# Patient Record
Sex: Male | Born: 1970 | Race: White | Hispanic: No | Marital: Married | Smoking: Current every day smoker
Health system: Southern US, Community
[De-identification: ages and names within clinical notes are randomized; demographics above are authoritative.]

## PROBLEM LIST (undated history)

## (undated) DIAGNOSIS — I10 Essential (primary) hypertension: Secondary | ICD-10-CM

## (undated) DIAGNOSIS — F411 Generalized anxiety disorder: Secondary | ICD-10-CM

## (undated) HISTORY — DX: Generalized anxiety disorder: F41.1

## (undated) HISTORY — DX: Essential (primary) hypertension: I10

## (undated) HISTORY — PX: NO PAST SURGERIES: SHX2092

---

## 2013-11-25 ENCOUNTER — Encounter: Payer: Self-pay | Admitting: Family Medicine

## 2013-11-25 ENCOUNTER — Ambulatory Visit (INDEPENDENT_AMBULATORY_CARE_PROVIDER_SITE_OTHER): Payer: Managed Care, Other (non HMO) | Admitting: Family Medicine

## 2013-11-25 VITALS — BP 135/85 | HR 91 | Ht 71.0 in | Wt 323.0 lb

## 2013-11-25 DIAGNOSIS — F411 Generalized anxiety disorder: Secondary | ICD-10-CM

## 2013-11-25 MED ORDER — ESCITALOPRAM OXALATE 10 MG PO TABS: 10.0000 mg | ORAL_TABLET | Freq: Every day | ORAL | Status: DC
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
## 2013-12-23 ENCOUNTER — Ambulatory Visit (INDEPENDENT_AMBULATORY_CARE_PROVIDER_SITE_OTHER): Payer: Managed Care, Other (non HMO) | Admitting: Family Medicine

## 2013-12-23 ENCOUNTER — Encounter: Payer: Self-pay | Admitting: Family Medicine

## 2013-12-23 VITALS — BP 133/78 | HR 92 | Resp 18 | Wt 325.0 lb

## 2013-12-23 DIAGNOSIS — F411 Generalized anxiety disorder: Secondary | ICD-10-CM

## 2013-12-23 DIAGNOSIS — Z131 Encounter for screening for diabetes mellitus: Secondary | ICD-10-CM

## 2013-12-23 HISTORY — DX: Generalized anxiety disorder: F41.1

## 2013-12-23 MED ORDER — ESCITALOPRAM OXALATE 20 MG PO TABS: 20.0000 mg | ORAL_TABLET | Freq: Every day | ORAL | Status: DC

## 2013-12-23 NOTE — Progress Notes (Signed)
CC: Tanner PicklerRandy Rodriguez is a 43 y.o. male is here for Anxiety   Subjective: HPI:  Followup anxiety: Has been taking Lexapro 10 mg on a daily basis without known side effects. States he is quite happy with reduction of irritability and subjective anxiety. States the quality of life is satisfactory however difficulty with mind racing keeping him awake when he tries to go to sleep. Symptoms are mild to moderate in severity occurring nightly lasting up to an hour. Denies paranoia or reoccurring fears. States it's anxiety he is thinking about involving the coming day. Denies depression or mental disturbance other than above.    Review Of Systems Outlined In HPI  Past Medical History  Diagnosis Date  . Generalized anxiety disorder 12/23/2013    No past surgical history on file. Family History  Problem Relation Age of Onset  . Alcoholism Father   . Cancer      grandmother  . Heart attack    . Depression      grandfather  . Diabetes Mother   . Hyperlipidemia Mother   . Hypertension      History   Social History  . Marital Status: Married    Spouse Name: N/A    Number of Children: N/A  . Years of Education: N/A   Occupational History  . Not on file.   Social History Main Topics  . Smoking status: Light Tobacco Smoker    Types: Cigarettes  . Smokeless tobacco: Current User    Types: Snuff     Comment: smokes on the weekends,dips snuff during the week  . Alcohol Use: No  . Drug Use: No  . Sexual Activity: Yes    Partners: Female   Other Topics Concern  . Not on file   Social History Narrative  . No narrative on file     Objective: BP 133/78  Pulse 92  Resp 18  Wt 325 lb (147.419 kg)  SpO2 97%  Vital signs reviewed. General: Alert and Oriented, No Acute Distress HEENT: Pupils equal, round, reactive to light. Conjunctivae clear.  External ears unremarkable.  Moist mucous membranes. Lungs: Clear and comfortable work of breathing, speaking in full sentences without  accessory muscle use. Cardiac: Regular rate and rhythm.  Extremities: No peripheral edema.  Strong peripheral pulses.  Mental Status: No depression, anxiety, nor agitation. Logical though process. Skin: Warm and dry. Assessment & Plan: Tanner Rodriguez was seen today for anxiety.  Diagnoses and associated orders for this visit:  Generalized anxiety disorder - escitalopram (LEXAPRO) 20 MG tablet; Take 1 tablet (20 mg total) by mouth daily.  Diabetes mellitus screening - BASIC METABOLIC PANEL WITH GFR    Generalized anxiety disorder: Improving but those would like to see if increasing his dose to 20 mg would help with difficulty falling asleep. If not beneficial or side effects occur drop back to 10 mg daily. Followup in 3-6 months if still satisfied with response He has never had diabetic screening his father is a diabetic therefore he's quite open to having fasting blood work next week lab slips given  Return in about 6 months (around 06/25/2014).

## 2014-05-12 ENCOUNTER — Encounter: Payer: Self-pay | Admitting: Family Medicine

## 2014-05-12 ENCOUNTER — Ambulatory Visit (INDEPENDENT_AMBULATORY_CARE_PROVIDER_SITE_OTHER): Payer: Managed Care, Other (non HMO) | Admitting: Family Medicine

## 2014-05-12 VITALS — BP 107/65 | HR 99 | Wt 353.0 lb

## 2014-05-12 DIAGNOSIS — L219 Seborrheic dermatitis, unspecified: Secondary | ICD-10-CM | POA: Insufficient documentation

## 2014-05-12 HISTORY — DX: Seborrheic dermatitis, unspecified: L21.9

## 2014-05-12 MED ORDER — KETOCONAZOLE 2 % EX CREA: 1.0000 "application " | TOPICAL_CREAM | Freq: Two times a day (BID) | CUTANEOUS | Status: DC

## 2014-05-12 NOTE — Progress Notes (Signed)
CC: Tanner PicklerRandy Rodriguez is a 43 y.o. male is here for red irritated lesions on face and scalp   Subjective: HPI:  Rash on the face and scalp that has been present for the patient's entire life but over the past month has become severe in severity. Symptoms have improved with introduction improving the past but ineffective now. Nothing particularly makes symptoms better or worse other than above. Is described as tender to the touch and sometimes stings. Denies ever having elsewhere on his body. This persisted throughout the day. Denies fevers, chills, flushing, nor discharge from the eyes.  He tells me that symptoms are worse when he allows his hair to grow out on his scalp or facially   Review Of Systems Outlined In HPI  Past Medical History  Diagnosis Date  . Generalized anxiety disorder 12/23/2013    No past surgical history on file. Family History  Problem Relation Age of Onset  . Alcoholism Father   . Cancer      grandmother  . Heart attack    . Depression      grandfather  . Diabetes Mother   . Hyperlipidemia Mother   . Hypertension      History   Social History  . Marital Status: Married    Spouse Name: N/A    Number of Children: N/A  . Years of Education: N/A   Occupational History  . Not on file.   Social History Main Topics  . Smoking status: Light Tobacco Smoker    Types: Cigarettes  . Smokeless tobacco: Current User    Types: Snuff     Comment: smokes on the weekends,dips snuff during the week  . Alcohol Use: No  . Drug Use: No  . Sexual Activity: Yes    Partners: Female   Other Topics Concern  . Not on file   Social History Narrative  . No narrative on file     Objective: BP 107/65  Pulse 99  Wt 353 lb (160.12 kg)  Vital signs reviewed. General: Alert and Oriented, No Acute Distress HEENT: Pupils equal, round, reactive to light. Conjunctivae clear.  External ears unremarkable.  Moist mucous membranes. Lungs: Clear and comfortable work of  breathing, speaking in full sentences without accessory muscle use. Cardiac: Regular rate and rhythm.  Neuro: CN II-XII grossly intact, gait normal. Extremities: No peripheral edema.  Strong peripheral pulses.  Mental Status: No depression, anxiety, nor agitation. Logical though process. Skin: Warm and dry. On the face and scalp there is a moderate amount of confluent papular patches of erythema, within the eyebrows there is considerable amount of flaking.  Assessment & Plan: Tanner HeckRandy was seen today for red irritated lesions on face and scalp.  Diagnoses and associated orders for this visit:  Seborrheic dermatitis - ketoconazole (NIZORAL) 2 % cream; Apply 1 application topically 2 (two) times daily. Apply to face and scalp. For a total of four weeks.    Seborrheic dermatitis: Start ketoconazole for the next 4 weeks. I discussed with him that if he should not see any improvement I next week please call me as there is a slim possibility this could be rosacea  Return if symptoms worsen or fail to improve.

## 2014-05-12 NOTE — Patient Instructions (Signed)
Seborrheic Dermatitis °Seborrheic dermatitis involves pink or red skin with greasy, flaky scales. This is often found on the scalp, eyebrows, nose, bearded area, and on or behind the ears. It can also occur on the central chest. It often occurs where there are more oil (sebaceous) glands. This condition is also known as dandruff. When this condition affects a baby's scalp, it is called cradle cap. It may come and go for no known reason. It can occur at any time of life from infancy to old age. °CAUSES  °The cause is unknown. It is not the result of too little moisture or too much oil. In some people, seborrheic dermatitis flare-ups seem to be triggered by stress. It also commonly occurs in people with certain diseases such as Parkinson's disease or HIV/AIDS. °SYMPTOMS  °· Thick scales on the scalp. °· Redness on the face or in the armpits. °· The skin may seem oily or dry, but moisturizers do not help. °· In infants, seborrheic dermatitis appears as scaly redness that does not seem to bother the baby. In some babies, it affects only the scalp. In others, it also affects the neck creases, armpits, groin, or behind the ears. °· In adults and adolescents, seborrheic dermatitis may affect only the scalp. It may look patchy or spread out, with areas of redness and flaking. Other areas commonly affected include: °¨ Eyebrows. °¨ Eyelids. °¨ Forehead. °¨ Skin behind the ears. °¨ Outer ears. °¨ Chest. °¨ Armpits. °¨ Nose creases. °¨ Skin creases under the breasts. °¨ Skin between the buttocks. °¨ Groin. °· Some adults and adolescents feel itching or burning in the affected areas. °DIAGNOSIS  °Your caregiver can usually tell what the problem is by doing a physical exam. °TREATMENT  °· Cortisone (steroid) ointments, creams, and lotions can help decrease inflammation. °· Babies can be treated with baby oil to soften the scales, then they may be washed with baby shampoo. If this does not help, a prescription topical steroid  medicine may work. °· Adults can use medicated shampoos. °· Your caregiver may prescribe corticosteroid cream and shampoo containing an antifungal or yeast medicine (ketoconazole). Hydrocortisone or anti-yeast cream can be rubbed directly onto seborrheic dermatitis patches. Yeast does not cause seborrheic dermatitis, but it seems to add to the problem. °In infants, seborrheic dermatitis is often worst during the first year of life. It tends to disappear on its own as the child grows. However, it may return during the teenage years. In adults and adolescents, seborrheic dermatitis tends to be a long-lasting condition that comes and goes over many years. °HOME CARE INSTRUCTIONS  °· Use prescribed medicines as directed. °· In infants, do not aggressively remove the scales or flakes on the scalp with a comb or by other means. This may lead to hair loss. °SEEK MEDICAL CARE IF:  °· The problem does not improve from the medicated shampoos, lotions, or other medicines given by your caregiver. °· You have any other questions or concerns. °Document Released: 10/06/2005 Document Revised: 04/06/2012 Document Reviewed: 02/25/2010 °ExitCare® Patient Information ©2015 [employer]. This information is not intended to replace advice given to you by your health care provider. Make sure you discuss any questions you have with your health care provider. ° °

## 2014-06-28 ENCOUNTER — Telehealth: Payer: Self-pay | Admitting: Family Medicine

## 2014-06-28 MED ORDER — TERBINAFINE HCL 250 MG PO TABS: 250.0000 mg | ORAL_TABLET | Freq: Every day | ORAL | Status: DC

## 2014-06-28 NOTE — Telephone Encounter (Signed)
Rash on head better but not fully gone with ketoconazole, trying 3 months of lamisil.

## 2014-08-10 ENCOUNTER — Other Ambulatory Visit: Payer: Self-pay | Admitting: *Deleted

## 2014-08-10 DIAGNOSIS — L219 Seborrheic dermatitis, unspecified: Secondary | ICD-10-CM

## 2014-08-10 MED ORDER — KETOCONAZOLE 2 % EX CREA: 1.0000 "application " | TOPICAL_CREAM | Freq: Two times a day (BID) | CUTANEOUS | Status: DC

## 2014-12-29 ENCOUNTER — Telehealth: Payer: Self-pay | Admitting: Family Medicine

## 2014-12-29 ENCOUNTER — Ambulatory Visit (INDEPENDENT_AMBULATORY_CARE_PROVIDER_SITE_OTHER): Payer: Managed Care, Other (non HMO) | Admitting: Family Medicine

## 2014-12-29 ENCOUNTER — Encounter: Payer: Self-pay | Admitting: Family Medicine

## 2014-12-29 VITALS — BP 144/94 | HR 89 | Wt 364.0 lb

## 2014-12-29 DIAGNOSIS — R0683 Snoring: Secondary | ICD-10-CM

## 2014-12-29 DIAGNOSIS — G478 Other sleep disorders: Secondary | ICD-10-CM

## 2014-12-29 DIAGNOSIS — R5383 Other fatigue: Secondary | ICD-10-CM

## 2014-12-29 DIAGNOSIS — E669 Obesity, unspecified: Secondary | ICD-10-CM

## 2014-12-29 MED ORDER — PHENTERMINE HCL 30 MG PO TBDP: ORAL_TABLET | ORAL | Status: DC

## 2014-12-29 NOTE — Progress Notes (Signed)
CC: Tanner PicklerRandy Riden is a 44 y.o. male is here for sleep study   Subjective: HPI:  Department of transportation has required him to obtain a sleep study due to the circumference of his neck. He tells me that he does experience nonrestorative sleep and his girlfriend says he snores all night long. No witnessed apneic episodes. He also endorses fatigue.  Difficulty losing weight. He wants a medication to help with weight loss. He's tried exercising but cannot break through the threshold where it is easy to habitually exercise. Has difficulty with portion control. Has never tried anything in the past. Has difficulty with sticking to a diet.   Review Of Systems Outlined In HPI  Past Medical History  Diagnosis Date  . Generalized anxiety disorder 12/23/2013    No past surgical history on file. Family History  Problem Relation Age of Onset  . Alcoholism Father   . Cancer      grandmother  . Heart attack    . Depression      grandfather  . Diabetes Mother   . Hyperlipidemia Mother   . Hypertension      History   Social History  . Marital Status: Married    Spouse Name: N/A  . Number of Children: N/A  . Years of Education: N/A   Occupational History  . Not on file.   Social History Main Topics  . Smoking status: Light Tobacco Smoker    Types: Cigarettes  . Smokeless tobacco: Current User    Types: Snuff     Comment: smokes on the weekends,dips snuff during the week  . Alcohol Use: No  . Drug Use: No  . Sexual Activity:    Partners: Female   Other Topics Concern  . Not on file   Social History Narrative     Objective: BP 144/94 mmHg  Pulse 89  Wt 364 lb (165.109 kg)  Vital signs reviewed. General: Alert and Oriented, No Acute Distress HEENT: Pupils equal, round, reactive to light. Conjunctivae clear.  External ears unremarkable.  Moist mucous membranes. Lungs: Clear and comfortable work of breathing, speaking in full sentences without accessory muscle  use. Cardiac: Regular rate and rhythm.  Neuro: CN II-XII grossly intact, gait normal. Extremities: No peripheral edema.  Strong peripheral pulses.  Mental Status: No depression, anxiety, nor agitation. Logical though process. Skin: Warm and dry.  Assessment & Plan: [name] was seen today for sleep study.  Diagnoses and all orders for this visit:  Snoring Orders: -     Ambulatory referral to Sleep Studies  Other fatigue Orders: -     Ambulatory referral to Sleep Studies  Non-restorative sleep Orders: -     Ambulatory referral to Sleep Studies  Obesity Orders: -     Phentermine HCl 30 MG TBDP; One by mouth daily for weight loss.   Snoring with nonrestorative sleep and fatigue. Seems very reasonable to get a sleep study, he would prefer to have it done at the [facility] [place] Obesity: Discussed risks and benefits of diet medications including but not limited to phentermine. He would like to try phentermine. Discussed that he needs to be losing weight to be a candidate for refills. Discussed importance of diet and exercise as a foundation for weight loss  Return in about 4 weeks (around 01/26/2015).

## 2014-12-29 NOTE — Telephone Encounter (Signed)
Re:  Referral for Sleep [facility] Tanner Rodriguez, sleep studies are put in as an Order. Please put an Order in.  Thank you.

## 2015-01-15 ENCOUNTER — Telehealth: Payer: Self-pay | Admitting: Family Medicine

## 2015-01-15 NOTE — Telephone Encounter (Signed)
Tanner Rodriguez, Will you please let patient know that [facility] is having trouble contacting him.  They are trying to schedule a sleep study, their number is [phone].  If you are unable to contact him by phone can you please mail him a letter including the letter that they sent us that I've placed in your inbox.

## 2015-01-15 NOTE — Telephone Encounter (Signed)
Letter mailed

## 2015-01-22 ENCOUNTER — Telehealth: Payer: Self-pay | Admitting: Family Medicine

## 2015-01-22 NOTE — Telephone Encounter (Signed)
Sue Lushndrea, Will you please let [facility] [phone] know that [facility]'s split sleep study has been approved by [employer] in case they did not get this approval letter.  If they need a copy i've placed it in your inbox, if not then can you please send to scan folder?

## 2015-01-24 ENCOUNTER — Telehealth: Payer: Self-pay | Admitting: Family Medicine

## 2015-01-24 DIAGNOSIS — G4733 Obstructive sleep apnea (adult) (pediatric): Secondary | ICD-10-CM

## 2015-01-24 HISTORY — DX: Obstructive sleep apnea (adult) (pediatric): G47.33

## 2015-01-24 MED ORDER — AMBULATORY NON FORMULARY MEDICATION: Status: DC

## 2015-01-24 NOTE — Telephone Encounter (Signed)
Sue Lushndrea, Can you see if Fayrene FearingJames from [facility] can help this patient with a CPAP machine, Rx in your inbox, please send sleep study to scan folder after you're done with it (also in your inbox).  Also can you please update patient about this news, OSA was present to a moderate degree. F/U with me one month after starting CPAP

## 2015-01-24 NOTE — Telephone Encounter (Signed)
Faxed paper work; notified pt; pt states as 01/18/2015 he does not have insurance. Pt states his new job will pay the cpap machine and to leave all the info up at the front for him to come pick up

## 2015-01-24 NOTE — Telephone Encounter (Signed)
They are aware

## 2015-01-26 ENCOUNTER — Ambulatory Visit: Payer: Managed Care, Other (non HMO) | Admitting: Family Medicine

## 2015-02-01 ENCOUNTER — Telehealth: Payer: Self-pay | Admitting: *Deleted

## 2015-02-01 NOTE — Telephone Encounter (Signed)
James from [facility] called just to let you know that they have attempted to contact the pt at least 3x and he has never gotten back with them regarding his ins.

## 2015-10-18 ENCOUNTER — Other Ambulatory Visit: Payer: Self-pay | Admitting: Family Medicine

## 2015-10-20 ENCOUNTER — Telehealth: Payer: Self-pay | Admitting: Family Medicine

## 2015-10-20 MED ORDER — KETOCONAZOLE 2 % EX CREA: 1.0000 "application " | TOPICAL_CREAM | Freq: Two times a day (BID) | CUTANEOUS | Status: DC

## 2015-10-20 NOTE — Telephone Encounter (Signed)
Patient called the emergency after hour phone line because he is running out of his ketoconazole cream. 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 I reviewed the chart. It has been since March of 2016 since he was last seen by his doctor. I refilled a small amount. He should follow up with PCP for further refills.

## 2015-10-23 ENCOUNTER — Telehealth: Payer: Self-pay | Admitting: Family Medicine

## 2015-10-23 MED ORDER — KETOCONAZOLE 2 % EX CREA: 1.0000 "application " | TOPICAL_CREAM | Freq: Two times a day (BID) | CUTANEOUS | Status: DC

## 2015-10-23 NOTE — Telephone Encounter (Signed)
Refill req 

## 2015-10-26 ENCOUNTER — Encounter: Payer: Self-pay | Admitting: Family Medicine

## 2015-10-26 ENCOUNTER — Ambulatory Visit (INDEPENDENT_AMBULATORY_CARE_PROVIDER_SITE_OTHER): Payer: Managed Care, Other (non HMO) | Admitting: Family Medicine

## 2015-10-26 VITALS — BP 148/87 | HR 70 | Wt 350.0 lb

## 2015-10-26 DIAGNOSIS — G4733 Obstructive sleep apnea (adult) (pediatric): Secondary | ICD-10-CM

## 2015-10-26 DIAGNOSIS — N529 Male erectile dysfunction, unspecified: Secondary | ICD-10-CM | POA: Diagnosis not present

## 2015-10-26 DIAGNOSIS — L219 Seborrheic dermatitis, unspecified: Secondary | ICD-10-CM

## 2015-10-26 MED ORDER — KETOCONAZOLE 2 % EX CREA: 1.0000 "application " | TOPICAL_CREAM | Freq: Two times a day (BID) | CUTANEOUS | Status: DC

## 2015-10-26 MED ORDER — SILDENAFIL CITRATE 100 MG PO TABS: 50.0000 mg | ORAL_TABLET | Freq: Every day | ORAL | Status: DC | PRN

## 2015-10-26 NOTE — Progress Notes (Signed)
CC: Tanner PicklerRandy Rodriguez is a 45 y.o. male is here for Medication Refill; Tinnitus; and Man problems   Subjective: HPI:  Follow follow-up seborrheic dermatitis: He has run out of ketoconazole. He is using this on a 2 days out of the week schedule and is very satisfied with results. As long as he uses this twice a week he gets no redness or flaking of the scalp. He denies any other skin complaints. He denies any scalp pain. He denies fevers, chills or swollen lymph nodes.  He combines of difficulty maintaining an erection most sexual encounters. He still in a loving relationship with his wife and is monogamous and attracted to her. He has no difficulty initiating a erection. He denies any penile discharge or penile pain. He denies any penile lesions or testicular pain. No dysuria symptoms are moderate in severity which have been going on for over 3 months now not getting better or worse with time.  Review Of Systems Outlined In HPI  Past Medical History  Diagnosis Date  . Generalized anxiety disorder 12/23/2013    No past surgical history on file. Family History  Problem Relation Age of Onset  . Alcoholism Father   . Cancer      grandmother  . Heart attack    . Depression      grandfather  . Diabetes Mother   . Hyperlipidemia Mother   . Hypertension      Social History   Social History  . Marital Status: Married    Spouse Name: N/A  . Number of Children: N/A  . Years of Education: N/A   Occupational History  . Not on file.   Social History Main Topics  . Smoking status: Light Tobacco Smoker    Types: Cigarettes  . Smokeless tobacco: Current User    Types: Snuff     Comment: smokes on the weekends,dips snuff during the week  . Alcohol Use: No  . Drug Use: No  . Sexual Activity:    Partners: Female   Other Topics Concern  . Not on file   Social History Narrative     Objective: BP 148/87 mmHg  Pulse 70  Wt 350 lb (158.759 kg)  General: Alert and Oriented, No Acute  Distress HEENT: Pupils equal, round, reactive to light. Conjunctivae clear.  Moist mucous membranes Lungs: Clear to auscultation bilaterally, no wheezing/ronchi/rales.  Comfortable work of breathing. Good air movement. Cardiac: Regular rate and rhythm. Normal S1/S2.  No murmurs, rubs, nor gallops.   Extremities: No peripheral edema.  Strong peripheral pulses.  Mental Status: No depression, anxiety, nor agitation. Skin: Warm and dry.  Assessment & Plan: Tanner Rodriguez was seen today for medication refill, tinnitus and man problems.  Diagnoses and all orders for this visit:  Seborrheic dermatitis -     ketoconazole (NIZORAL) 2 % cream; Apply 1 application topically 2 (two) times daily.  Obstructive sleep apnea  Erectile dysfunction, unspecified erectile dysfunction type -     sildenafil (VIAGRA) 100 MG tablet; Take 0.5-1 tablets (50-100 mg total) by mouth daily as needed for erectile dysfunction.   Seborrheic dermatitis: Extremely well controlled with ketoconazole use was a week. Obstructive sleep apnea: Use of his device is helping with weight loss Erectile dysfunction: Uncontrolled chronic condition therefore start Viagra. If unaffordable there is always generic sildenafil [facility]  Return in about 1 year (around 10/25/2016).

## 2015-10-29 ENCOUNTER — Telehealth: Payer: Self-pay

## 2015-10-29 MED ORDER — SILDENAFIL CITRATE 20 MG PO TABS: ORAL_TABLET | ORAL | Status: DC

## 2015-10-29 NOTE — Telephone Encounter (Signed)
Insurance will not cover sildenafil.

## 2015-10-29 NOTE — Telephone Encounter (Signed)
Will you let him know that [facility] in [place] offers a generic form of this if a patient is willing to pay out of pocket, I'll print off a Rx for this.  If he is interested in this can you give him the address of [facility].

## 2015-10-29 NOTE — Telephone Encounter (Signed)
Rx faxed

## 2015-12-07 ENCOUNTER — Ambulatory Visit: Payer: Managed Care, Other (non HMO) | Admitting: Family Medicine

## 2015-12-10 ENCOUNTER — Encounter: Payer: Self-pay | Admitting: Family Medicine

## 2015-12-10 ENCOUNTER — Ambulatory Visit (INDEPENDENT_AMBULATORY_CARE_PROVIDER_SITE_OTHER): Payer: Managed Care, Other (non HMO) | Admitting: Family Medicine

## 2015-12-10 VITALS — BP 133/86 | HR 69 | Wt 350.0 lb

## 2015-12-10 DIAGNOSIS — N400 Enlarged prostate without lower urinary tract symptoms: Secondary | ICD-10-CM

## 2015-12-10 DIAGNOSIS — M791 Myalgia: Secondary | ICD-10-CM | POA: Diagnosis not present

## 2015-12-10 DIAGNOSIS — M7918 Myalgia, other site: Secondary | ICD-10-CM

## 2015-12-10 MED ORDER — MELOXICAM 15 MG PO TABS
15.0000 mg | ORAL_TABLET | Freq: Every day | ORAL | Status: DC
Start: 2015-12-10 — End: 2016-04-18

## 2015-12-10 MED ORDER — TAMSULOSIN HCL 0.4 MG PO CAPS
0.4000 mg | ORAL_CAPSULE | Freq: Every day | ORAL | Status: DC
Start: 2015-12-10 — End: 2016-04-18

## 2015-12-10 NOTE — Progress Notes (Signed)
CC: Tanner Rodriguez is a 45 y.o. male is here for Back Pain   Subjective: HPI:  2 weeks of upper left-sided back pain. Only present when stationary. Nothing else seems to make it worse. It improved with a TENS unit or when taking ibuprofen. No recent or remote injury. Pain is nonradiating. This just to the left of the midline of the spine. Pain is mild in severity. Denies pain with breathing, lifting objects or any weakness. Denies cough, shortness of breath or exertional chest pain.  He is concerned that his prostate might be enlarged. He is waking up to urinate twice a night. He denies any urinary hesitancy or decreased stream. He denies any sensation of incomplete voiding. Denies any dysuria. No pelvic or rectal pain.  Review Of Systems Outlined In HPI  Past Medical History  Diagnosis Date  . Generalized anxiety disorder 12/23/2013    No past surgical history on file. Family History  Problem Relation Age of Onset  . Alcoholism Father   . Cancer      grandmother  . Heart attack    . Depression      grandfather  . Diabetes Mother   . Hyperlipidemia Mother   . Hypertension      Social History   Social History  . Marital Status: Married    Spouse Name: N/A  . Number of Children: N/A  . Years of Education: N/A   Occupational History  . Not on file.   Social History Main Topics  . Smoking status: Light Tobacco Smoker    Types: Cigarettes  . Smokeless tobacco: Current User    Types: Snuff     Comment: smokes on the weekends,dips snuff during the week  . Alcohol Use: No  . Drug Use: No  . Sexual Activity:    Partners: Female   Other Topics Concern  . Not on file   Social History Narrative     Objective: BP 133/86 mmHg  Pulse 69  Wt 350 lb (158.759 kg)  General: Alert and Oriented, No Acute Distress HEENT: Pupils equal, round, reactive to light. Conjunctivae clear.  Moist mucous membranes Lungs: Clear to auscultation bilaterally, no wheezing/ronchi/rales.   Comfortable work of breathing. Good air movement. Cardiac: Regular rate and rhythm. Normal S1/S2.  No murmurs, rubs, nor gallops.    back: Pain is reproduced with palpation of the left-sided rhomboid muscle. No midline spinous process tenderness in thoracic region  Extremities: No peripheral edema.  Strong peripheral pulses.  Mental Status: No depression, anxiety, nor agitation. Skin: Warm and dry.  Assessment & Plan: Laddie was seen today for back pain.  Diagnoses and all orders for this visit:  BPH (benign prostatic hyperplasia) -     tamsulosin (FLOMAX) 0.4 MG CAPS capsule; Take 1 capsule (0.4 mg total) by mouth daily.  Rhomboid muscle pain -     meloxicam (MOBIC) 15 MG tablet; Take 1 tablet (15 mg total) by mouth daily.    BPH: Start Flomax. Call if no improvement in 1-2 weeks.   rhomboid muscle strain: He was given a home rehabilitation exercise plan to engage in on a daily basis for the next 2-3 weeks. Additionally stop ibuprofen and switch to meloxicam  Return if symptoms worsen or fail to improve.

## 2016-04-18 ENCOUNTER — Ambulatory Visit (INDEPENDENT_AMBULATORY_CARE_PROVIDER_SITE_OTHER): Payer: Managed Care, Other (non HMO) | Admitting: Family Medicine

## 2016-04-18 ENCOUNTER — Telehealth: Payer: Self-pay | Admitting: Family Medicine

## 2016-04-18 ENCOUNTER — Encounter: Payer: Self-pay | Admitting: Family Medicine

## 2016-04-18 VITALS — BP 132/80 | HR 74 | Wt 353.0 lb

## 2016-04-18 DIAGNOSIS — L219 Seborrheic dermatitis, unspecified: Secondary | ICD-10-CM

## 2016-04-18 DIAGNOSIS — G4733 Obstructive sleep apnea (adult) (pediatric): Secondary | ICD-10-CM

## 2016-04-18 MED ORDER — KETOCONAZOLE 2 % EX CREA: 1.0000 "application " | TOPICAL_CREAM | Freq: Two times a day (BID) | CUTANEOUS | Status: DC

## 2016-04-18 NOTE — Progress Notes (Signed)
CC: Tanner PicklerRandy Rodriguez is a 45 y.o. male is here for No chief complaint on file.   Subjective: HPI:  Follow-up sleep apnea: He is using his device every night for at least 4 hours and has been doing so ever since he got the machine almost a year ago. He tells me that he uses it for a total of 6 hours every night and feels like it's providing him with restful sleep. He's noticed that the mask is starting to crack. He found out that he actually has no service provider for this CPAP machine and needs to start replacing some parts.   He also would like a refill for ketoconazole for his seborrheic dermatitis which is been suppressed ever since he started using it a few years ago.     Review Of Systems Outlined In HPI  Past Medical History  Diagnosis Date  . Generalized anxiety disorder 12/23/2013    No past surgical history on file. Family History  Problem Relation Age of Onset  . Alcoholism Father   . Cancer      grandmother  . Heart attack    . Depression      grandfather  . Diabetes Mother   . Hyperlipidemia Mother   . Hypertension      Social History   Social History  . Marital Status: Married    Spouse Name: N/A  . Number of Children: N/A  . Years of Education: N/A   Occupational History  . Not on file.   Social History Main Topics  . Smoking status: Light Tobacco Smoker    Types: Cigarettes  . Smokeless tobacco: Current User    Types: Snuff     Comment: smokes on the weekends,dips snuff during the week  . Alcohol Use: No  . Drug Use: No  . Sexual Activity:    Partners: Female   Other Topics Concern  . Not on file   Social History Narrative     Objective: BP 132/80 mmHg  Pulse 74  Wt 353 lb (160.12 kg)  Vital signs reviewed. General: Alert and Oriented, No Acute Distress HEENT: Pupils equal, round, reactive to light. Conjunctivae clear.  External ears unremarkable.  Moist mucous membranes. Lungs: Clear and comfortable work of breathing, speaking in full  sentences without accessory muscle use. Cardiac: Regular rate and rhythm.  Neuro: CN II-XII grossly intact, gait normal. Extremities: No peripheral edema.  Strong peripheral pulses.  Mental Status: No depression, anxiety, nor agitation. Logical though process. Skin: Warm and dry.No abnormalities of the scalp   Assessment & Plan: Diagnoses and all orders for this visit:  Seborrheic dermatitis -     ketoconazole (NIZORAL) 2 % cream; Apply 1 application topically 2 (two) times daily.  Obstructive sleep apnea   Seborrheic dermatitis is controlled with ketoconazole   obstructive sleep apnea controlled but due for new CPAP supplies. Since he is not already using his insurance to cover CPAP machine on going to ask my assistant to get in touch with [employer] or any other covered respiratory supply company to get him a new device and to start servicing his machine.  Return if symptoms worsen or fail to improve.

## 2016-04-18 NOTE — Telephone Encounter (Signed)
Tanner Rodriguez, Can you please contact [name] [phone]) and see if they can send us the data from Tanner Rodriguez's sleep study back in spring of 2016.  I have a interpretation but I need the data. Once you have this data can you see if [name] can provide him with a cpap macine and supplies?  They will likely need the sleep study data.  CPAP @ 9cmH2O with heat and humidity, he prefers a nasal mask.

## 2016-04-21 NOTE — Telephone Encounter (Signed)
Left vm requesting documentation

## 2016-04-23 NOTE — Telephone Encounter (Signed)
Once you have this data (I'll leave it in my inbox) can you see if [name] can provide him with a cpap macine and supplies? They will likely need the sleep study data.  Rx: CPAP @ 9cmH2O and supplies with heat and humidity, he prefers a nasal mask.

## 2016-04-23 NOTE — Telephone Encounter (Signed)
Can you please recheck this request

## 2016-04-23 NOTE — Telephone Encounter (Signed)
Documentation in your box

## 2016-04-24 MED ORDER — AMBULATORY NON FORMULARY MEDICATION: Status: AC

## 2016-04-24 MED ORDER — AMBULATORY NON FORMULARY MEDICATION: Status: DC

## 2016-04-24 NOTE — Telephone Encounter (Signed)
Order faxed.

## 2016-05-15 ENCOUNTER — Encounter: Payer: Self-pay | Admitting: Family Medicine

## 2017-06-17 ENCOUNTER — Other Ambulatory Visit: Payer: Self-pay | Admitting: Physician Assistant

## 2017-06-17 DIAGNOSIS — L219 Seborrheic dermatitis, unspecified: Secondary | ICD-10-CM

## 2017-06-17 MED ORDER — KETOCONAZOLE 2 % EX CREA: 1.0000 "application " | TOPICAL_CREAM | Freq: Two times a day (BID) | CUTANEOUS | 3 refills | Status: DC

## 2018-10-05 ENCOUNTER — Other Ambulatory Visit: Payer: Self-pay | Admitting: Physician Assistant

## 2018-10-05 DIAGNOSIS — L219 Seborrheic dermatitis, unspecified: Secondary | ICD-10-CM

## 2018-10-06 ENCOUNTER — Other Ambulatory Visit: Payer: Self-pay

## 2018-10-06 DIAGNOSIS — L219 Seborrheic dermatitis, unspecified: Secondary | ICD-10-CM

## 2018-10-06 MED ORDER — KETOCONAZOLE 2 % EX CREA: 1.0000 "application " | TOPICAL_CREAM | Freq: Two times a day (BID) | CUTANEOUS | 3 refills | Status: DC

## 2019-05-20 ENCOUNTER — Other Ambulatory Visit: Payer: Self-pay

## 2019-05-20 ENCOUNTER — Encounter: Payer: Self-pay | Admitting: Physician Assistant

## 2019-05-20 ENCOUNTER — Ambulatory Visit (INDEPENDENT_AMBULATORY_CARE_PROVIDER_SITE_OTHER): Payer: 59 | Admitting: Physician Assistant

## 2019-05-20 VITALS — BP 142/87 | HR 68 | Ht 71.0 in | Wt 348.0 lb

## 2019-05-20 DIAGNOSIS — Z8249 Family history of ischemic heart disease and other diseases of the circulatory system: Secondary | ICD-10-CM

## 2019-05-20 DIAGNOSIS — R0789 Other chest pain: Secondary | ICD-10-CM | POA: Diagnosis not present

## 2019-05-20 DIAGNOSIS — Z0000 Encounter for general adult medical examination without abnormal findings: Secondary | ICD-10-CM

## 2019-05-20 DIAGNOSIS — G4733 Obstructive sleep apnea (adult) (pediatric): Secondary | ICD-10-CM

## 2019-05-20 DIAGNOSIS — L719 Rosacea, unspecified: Secondary | ICD-10-CM

## 2019-05-20 DIAGNOSIS — Z9989 Dependence on other enabling machines and devices: Secondary | ICD-10-CM

## 2019-05-20 DIAGNOSIS — I10 Essential (primary) hypertension: Secondary | ICD-10-CM

## 2019-05-20 DIAGNOSIS — Z131 Encounter for screening for diabetes mellitus: Secondary | ICD-10-CM

## 2019-05-20 DIAGNOSIS — Z13220 Encounter for screening for lipoid disorders: Secondary | ICD-10-CM | POA: Diagnosis not present

## 2019-05-20 DIAGNOSIS — E785 Hyperlipidemia, unspecified: Secondary | ICD-10-CM

## 2019-05-20 MED ORDER — LISINOPRIL 10 MG PO TABS: 10.0000 mg | ORAL_TABLET | Freq: Every day | ORAL | 1 refills | Status: DC

## 2019-05-20 MED ORDER — METRONIDAZOLE 1 % EX GEL: Freq: Every day | CUTANEOUS | 0 refills | Status: DC

## 2019-05-20 NOTE — Progress Notes (Signed)
Subjective:    Patient ID: Tanner Rodriguez, male    DOB: 18-Oct-1971, 48 y.o.   MRN: [id number]  HPI  Pt is a 48 yo male who presents to the clinic to officially establish care since PCP Dr. Ileene Rubens left clinic.   He has been noticing that his BP readings at home have been elevated in the 160s-180's over 100's. He does report some chest tightness mostly when he gets high readings. He has a wrist cuff. Denies any CP/chest tightness with exertion. He is concerned because his brother had MI 2 years and concerned he could have one.   He continues to smoke cigarettes daily. At least 1 pack a day.  He does not have daily SOB but reports intermittently using his wifes albuterol inhaler. Has OSA and uses CPAP.   He has a lot of redness on cheeks and forehead. He wonders what to do about it.    .. Family History  Problem Relation Age of Onset  . Alcoholism Father   . Cancer Other        grandmother  . Heart attack Other   . Depression Other        grandfather  . Diabetes Mother   . Hyperlipidemia Mother   . Hypertension Other       Review of Systems See HPI.     Objective:   Physical Exam Vitals signs reviewed.  Constitutional:      Appearance: He is well-developed. He is obese.  HENT:     Head: Normocephalic.  Cardiovascular:     Rate and Rhythm: Normal rate and regular rhythm.     Heart sounds: Normal heart sounds.  Pulmonary:     Effort: Pulmonary effort is normal.     Breath sounds: Normal breath sounds.  Chest:     Chest wall: No mass or tenderness.  Skin:    Comments: Bilateral cheek redness with some tiny erythematous colored papules.   Neurological:     General: No focal deficit present.     Mental Status: He is alert and oriented to person, place, and time.  Psychiatric:        Mood and Affect: Mood normal.        Behavior: Behavior normal.           Assessment & Plan:  Marland KitchenMarland KitchenRayen was seen today for establish care, chest pain and hypertension.  Diagnoses  and all orders for this visit:  Chest tightness -     Exercise Tolerance Test  OSA on CPAP  Screening for diabetes mellitus -     COMPLETE METABOLIC PANEL WITH GFR  Screening for lipid disorders -     Lipid Panel w/reflex Direct LDL  Preventative health care -     Lipid Panel w/reflex Direct LDL -     COMPLETE METABOLIC PANEL WITH GFR -     CBC with Differential/Platelet -     TSH  Morbid obesity (HCC) -     CBC with Differential/Platelet -     TSH -     Exercise Tolerance Test  Rosacea -     metroNIDAZOLE (METROGEL) 1 % gel; Apply topically daily.  Essential hypertension -     lisinopril (ZESTRIL) 10 MG tablet; Take 1 tablet (10 mg total) by mouth daily. -     Exercise Tolerance Test  Dyslipidemia (high LDL; low HDL) -     Exercise Tolerance Test  Family history of MI (myocardial infarction) -     Exercise  Tolerance Test     .. [facility]  Decreased Interest 0  Down, Depressed, Hopeless 0  PHQ - 2 Score 0  Altered sleeping 1  Tired, decreased energy 1  Change in appetite 0  Feeling bad or failure about yourself  0  Trouble concentrating 0  Moving slowly or fidgety/restless 0  Suicidal thoughts 0  PHQ-9 Score 2  Difficult doing work/chores Not difficult at all    EKG NSR. No ST elevation or depression.  Will get stress test.  Will get fasting labs.  Start lisinopril. Discussed SE.  Follow up in 3 weeks.   Given metrogel for rosacea.

## 2019-05-20 NOTE — Patient Instructions (Signed)
Will get labs. Will order stress test.  Start lisionpril.

## 2019-05-21 LAB — CBC WITH DIFFERENTIAL/PLATELET
Absolute Monocytes: 547 cells/uL (ref 200–950)
Basophils Absolute: 46 cells/uL (ref 0–200)
Basophils Relative: 0.6 %
Eosinophils Absolute: 139 cells/uL (ref 15–500)
Eosinophils Relative: 1.8 %
HCT: 45.8 % (ref 38.5–50.0)
Hemoglobin: 16.3 g/dL (ref 13.2–17.1)
Lymphs Abs: 2064 cells/uL (ref 850–3900)
MCH: 31.4 pg (ref 27.0–33.0)
MCHC: 35.6 g/dL (ref 32.0–36.0)
MCV: 88.2 fL (ref 80.0–100.0)
MPV: 11.3 fL (ref 7.5–12.5)
Monocytes Relative: 7.1 %
Neutro Abs: 4905 cells/uL (ref 1500–7800)
Neutrophils Relative %: 63.7 %
Platelets: 227 10*3/uL (ref 140–400)
RBC: 5.19 10*6/uL (ref 4.20–5.80)
RDW: 12.6 % (ref 11.0–15.0)
Total Lymphocyte: 26.8 %
WBC: 7.7 10*3/uL (ref 3.8–10.8)

## 2019-05-21 LAB — COMPLETE METABOLIC PANEL WITH GFR
AG Ratio: 1.3 (calc) (ref 1.0–2.5)
ALT: 28 U/L (ref 9–46)
AST: 26 U/L (ref 10–40)
Albumin: 4 g/dL (ref 3.6–5.1)
Alkaline phosphatase (APISO): 64 U/L (ref 36–130)
BUN: 14 mg/dL (ref 7–25)
CO2: 26 mmol/L (ref 20–32)
Calcium: 9.8 mg/dL (ref 8.6–10.3)
Chloride: 105 mmol/L (ref 98–110)
Creat: 1.11 mg/dL (ref 0.60–1.35)
GFR, Est African American: 90 mL/min/{1.73_m2} (ref >=60)
GFR, Est Non African American: 78 mL/min/{1.73_m2} (ref >=60)
Globulin: 3.1 g/dL (calc) (ref 1.9–3.7)
Glucose, Bld: 102 mg/dL — ABNORMAL HIGH (ref 65–99)
Potassium: 4.5 mmol/L (ref 3.5–5.3)
Sodium: 139 mmol/L (ref 135–146)
Total Bilirubin: 1.3 mg/dL — ABNORMAL HIGH (ref 0.2–1.2)
Total Protein: 7.1 g/dL (ref 6.1–8.1)

## 2019-05-21 LAB — LIPID PANEL W/REFLEX DIRECT LDL
Cholesterol: 201 mg/dL — ABNORMAL HIGH (ref <200)
HDL: 37 mg/dL — ABNORMAL LOW (ref >=40)
LDL Cholesterol (Calc): 140 mg/dL (calc) — ABNORMAL HIGH
Non-HDL Cholesterol (Calc): 164 mg/dL (calc) — ABNORMAL HIGH (ref <130)
Total CHOL/HDL Ratio: 5.4 (calc) — ABNORMAL HIGH (ref <5.0)
Triglycerides: 120 mg/dL (ref <150)

## 2019-05-21 LAB — TSH: TSH: 0.92 mIU/L (ref 0.40–4.50)

## 2019-05-23 ENCOUNTER — Encounter: Payer: Self-pay | Admitting: Physician Assistant

## 2019-05-23 DIAGNOSIS — R0789 Other chest pain: Secondary | ICD-10-CM | POA: Insufficient documentation

## 2019-05-23 DIAGNOSIS — I10 Essential (primary) hypertension: Secondary | ICD-10-CM | POA: Insufficient documentation

## 2019-05-23 DIAGNOSIS — L719 Rosacea, unspecified: Secondary | ICD-10-CM | POA: Insufficient documentation

## 2019-05-23 HISTORY — DX: Rosacea, unspecified: L71.9

## 2019-05-23 HISTORY — DX: Essential (primary) hypertension: I10

## 2019-05-23 HISTORY — DX: Morbid (severe) obesity due to excess calories: E66.01

## 2019-05-23 NOTE — Progress Notes (Signed)
Call pt: cholesterol is elevated and CV 10 year risk is 14.6 percent. We need to start statin. Ok to start. Have you been checking BP? What are they running with lisinopril? Bilirubin a little elevated. Will recheck in next few months. Start working on diet/lifestyle changes discussed in office.   Kidney, liver, thyroid look good.   Were you able to get metrogel? Has it helped with the facial redness?

## 2019-05-24 ENCOUNTER — Encounter: Payer: Self-pay | Admitting: Neurology

## 2019-05-24 MED ORDER — DESONIDE 0.05 % EX CREA: TOPICAL_CREAM | Freq: Two times a day (BID) | CUTANEOUS | 1 refills | Status: DC

## 2019-05-24 MED ORDER — ATORVASTATIN CALCIUM 40 MG PO TABS: 40.0000 mg | ORAL_TABLET | Freq: Every day | ORAL | 3 refills | Status: DC

## 2019-05-24 NOTE — Addendum Note (Signed)
Addended by: Donella Stade on: 05/24/2019 12:39 PM   Modules accepted: Orders

## 2019-05-24 NOTE — Progress Notes (Signed)
Ok sent desonide to try.

## 2019-05-24 NOTE — Addendum Note (Signed)
Addended by: Donella Stade on: 05/24/2019 03:50 PM   Modules accepted: Orders

## 2019-05-24 NOTE — Progress Notes (Signed)
I really like the last 5 readings.   Great work on Intermittent fasting!  Will send statin over.   I will try another cream. What bothers you the most the redness or the itchy scales?

## 2019-05-24 NOTE — Telephone Encounter (Signed)
This encounter was created in error - please disregard.

## 2019-05-27 ENCOUNTER — Encounter: Payer: Self-pay | Admitting: Physician Assistant

## 2019-06-02 ENCOUNTER — Other Ambulatory Visit: Payer: Self-pay | Admitting: Physician Assistant

## 2019-06-02 DIAGNOSIS — L219 Seborrheic dermatitis, unspecified: Secondary | ICD-10-CM

## 2019-07-01 ENCOUNTER — Encounter (HOSPITAL_COMMUNITY): Payer: Self-pay | Admitting: Physician Assistant

## 2019-07-08 ENCOUNTER — Telehealth (HOSPITAL_COMMUNITY): Payer: Self-pay

## 2019-07-08 NOTE — Telephone Encounter (Signed)
Can we call patient and see why he has not answered.   Thanks we will reach out.

## 2019-07-08 NOTE — Telephone Encounter (Signed)
Left message on machine for patient to call back.

## 2019-07-08 NOTE — Telephone Encounter (Signed)
New message   Just an FYI. We have made several attempts to contact this patient including sending a letter to schedule or reschedule their Exercise Tolerance Test. We will be removing the patient from the WQ.   9.16.20 @ 10:12am lm on home vm - Tanner Rodriguez   9.11.20 mail reminder letter Tanner Rodriguez  9.9.20 @ 12:06pm lm on home vm - Tanner Rodriguez

## 2019-07-10 MED ORDER — BARO-CAT PO
10.00 | ORAL | Status: DC
Start: ? — End: 2019-07-10

## 2019-07-10 MED ORDER — Medication
Status: DC
Start: ? — End: 2019-07-10

## 2019-07-11 ENCOUNTER — Encounter: Payer: Self-pay | Admitting: Physician Assistant

## 2019-07-13 ENCOUNTER — Encounter: Payer: Self-pay | Admitting: Physician Assistant

## 2019-07-13 ENCOUNTER — Ambulatory Visit (INDEPENDENT_AMBULATORY_CARE_PROVIDER_SITE_OTHER): Payer: 59 | Admitting: Physician Assistant

## 2019-07-13 ENCOUNTER — Other Ambulatory Visit: Payer: Self-pay

## 2019-07-13 VITALS — BP 137/78 | HR 65 | Ht 71.0 in | Wt 340.0 lb

## 2019-07-13 DIAGNOSIS — M47812 Spondylosis without myelopathy or radiculopathy, cervical region: Secondary | ICD-10-CM

## 2019-07-13 DIAGNOSIS — M5412 Radiculopathy, cervical region: Secondary | ICD-10-CM

## 2019-07-13 HISTORY — DX: Spondylosis without myelopathy or radiculopathy, cervical region: M47.812

## 2019-07-13 HISTORY — DX: Radiculopathy, cervical region: M54.12

## 2019-07-13 MED ORDER — HYDROCODONE-ACETAMINOPHEN 5-325 MG PO TABS: 1.0000 | ORAL_TABLET | Freq: Three times a day (TID) | ORAL | 0 refills | Status: AC | PRN

## 2019-07-13 MED ORDER — PREDNISONE 20 MG PO TABS: ORAL_TABLET | ORAL | 0 refills | Status: DC

## 2019-07-13 MED ORDER — MELOXICAM 15 MG PO TABS: 15.0000 mg | ORAL_TABLET | Freq: Every day | ORAL | 1 refills | Status: DC

## 2019-07-13 NOTE — Patient Instructions (Addendum)
Stop medrol dose pak. Start prednisone.  Continue flexeril.  Start exercises 3 times a day.  Use heat or ice which ever feels better.  Use tens unit.  norco as needed for severe pain  Cervical Radiculopathy  Cervical radiculopathy means that a nerve in the neck (a cervical nerve) is pinched or bruised. This can happen because of an injury to the cervical spine (vertebrae) in the neck, or as a normal part of getting older. This can cause pain or loss of feeling (numbness) that runs from your neck all the way down to your arm and fingers. Often, this condition gets better with rest. Treatment may be needed if the condition does not get better. What are the causes?  A neck injury.  A bulging disk in your spine.  Muscle movements that you cannot control (muscle spasms).  Tight muscles in your neck due to overuse.  Arthritis.  Breakdown in the bones and joints of the spine (spondylosis) due to getting older.  Bone spurs that form near the nerves in the neck. What are the signs or symptoms?  Pain. The pain may: ? Run from the neck to the arm and hand. ? Be very bad or irritating. ? Be worse when you move your neck.  Loss of feeling or tingling in your arm or hand.  Weakness in your arm or hand, in very bad cases. How is this treated? In many cases, treatment is not needed for this condition. With rest, the condition often gets better over time. If treatment is needed, options may include:  Wearing a soft neck collar (cervical collar) for short periods of time, as told by your doctor.  Doing exercises (physical therapy) to strengthen your neck muscles.  Taking medicines.  Having shots (injections) in your spine, in very bad cases.  Having surgery. This may be needed if other treatments do not help. The type of surgery that is used depends on the cause of your condition. Follow these instructions at home: If you have a soft neck collar:  Wear it as told by your doctor.  Remove it only as told by your doctor.  Ask your doctor if you can remove the collar for cleaning and bathing. If you are allowed to remove the collar for cleaning or bathing: ? Follow instructions from your doctor about how to remove the collar safely. ? Clean the collar by wiping it with mild soap and water and drying it completely. ? Take out any removable pads in the collar every 1-2 days. Wash them by hand with soap and water. Let them air-dry completely before you put them back in the collar. ? Check your skin under the collar for redness or sores. If you see any, tell your doctor. Managing pain      Take over-the-counter and prescription medicines only as told by your doctor.  If told, put ice on the painful area. ? If you have a soft neck collar, remove it as told by your doctor. ? Put ice in a plastic bag. ? Place a towel between your skin and the bag. ? Leave the ice on for 20 minutes, 2-3 times a day.  If using ice does not help, you can try using heat. Use the heat source that your doctor recommends, such as a moist heat pack or a heating pad. ? Place a towel between your skin and the heat source. ? Leave the heat on for 20-30 minutes. ? Remove the heat if your skin turns bright  red. This is very important if you are unable to feel pain, heat, or cold. You may have a greater risk of getting burned.  You may try a gentle neck and shoulder rub (massage). Activity  Rest as needed.  Return to your normal activities as told by your doctor. Ask your doctor what activities are safe for you.  Do exercises as told by your doctor or physical therapist.  Do not lift anything that is heavier than 10 lb (4.5 kg) until your doctor tells you that it is safe. General instructions  Use a flat pillow when you sleep.  Do not drive while wearing a soft neck collar. If you do not have a soft neck collar, ask your doctor if it is safe to drive while your neck heals.  Ask your doctor if  the medicine prescribed to you requires you to avoid driving or using heavy machinery.  Do not use any products that contain nicotine or tobacco, such as cigarettes, e-cigarettes, and chewing tobacco. These can delay healing. If you need help quitting, ask your doctor.  Keep all follow-up visits as told by your doctor. This is important. Contact a doctor if:  Your condition does not get better with treatment. Get help right away if:  Your pain gets worse and is not helped with medicine.  You lose feeling or feel weak in your hand, arm, face, or leg.  You have a high fever.  You have a stiff neck.  You cannot control when you poop or pee (have incontinence).  You have trouble with walking, balance, or talking. Summary  Cervical radiculopathy means that a nerve in the neck is pinched or bruised.  A nerve can get pinched from a bulging disk, arthritis, an injury to the neck, or other causes.  Symptoms include pain, tingling, or loss of feeling that goes from the neck into the arm or hand.  Weakness in your arm or hand can happen in very bad cases.  Treatment may include resting, wearing a soft neck collar, and doing exercises. You might need to take medicines for pain. In very bad cases, shots or surgery may be needed. This information is not intended to replace advice given to you by your health care provider. Make sure you discuss any questions you have with your health care provider. Document Released: 09/25/2011 Document Revised: 08/27/2018 Document Reviewed: 08/27/2018 Elsevier Patient Education  2020 [employer].

## 2019-07-13 NOTE — Progress Notes (Signed)
Subjective:    Patient ID: Tanner Rodriguez, male    DOB: 08-May-1971, 48 y.o.   MRN: [id number]  HPI  Pt is a 48 yo obese male who presents to the clinic to follow up after ED visit for neck and left arm pain. He was seen on 07/10/19 after 3 days of neck stiffness and numbness/tingling/pain going down left arm into thumb, index, middle fingers. Xrays showed.   1. Limited examination due to lack of inclusion of C6 and C7 vertebral bodies. Within the limitations of the study, no acute fractures or dislocations in visualized cervical spine.. Repeat examination or CT can performed for further evaluation. 2. Mild multilevel spondylosis.  He was given medrol dose pack, flexeril and tramadol. It does not seem to help.   He has now been in pain for 6 days and does not seem to be improving. The only relief he gets is when he holds his left arm above his head. Denies any arm weakness. 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 NO known trauma.   .. Active Ambulatory Problems    Diagnosis Date Noted  . Generalized anxiety disorder 12/23/2013  . Seborrheic dermatitis 05/12/2014  . OSA on CPAP 01/24/2015  . Hypertension 05/23/2019  . Chest tightness 05/23/2019  . Morbid obesity (HCC) 05/23/2019  . Rosacea 05/23/2019  . Cervical spondylosis 07/13/2019  . Cervical radiculopathy 07/13/2019   Resolved Ambulatory Problems    Diagnosis Date Noted  . No Resolved Ambulatory Problems   No Additional Past Medical History       Review of Systems See HPI.     Objective:   Physical Exam Vitals signs reviewed.  Constitutional:      Appearance: Normal appearance. He is obese.  Cardiovascular:     Rate and Rhythm: Normal rate.     Pulses: Normal pulses.  Pulmonary:     Effort: Pulmonary effort is normal.     Breath sounds: Normal breath sounds.  Musculoskeletal:     Comments: Limited ROM of neck due to pain and stiffness. ROM of left shoulder normal. Relief of symptoms when left arm is completely  abducted to 180 degrees.  Right upper cervical paraspinal muscles.  Strength 5/5 upper ext.  Hand grip 5/5.   Neurological:     Mental Status: He is alert.           Assessment & Plan:  Marland KitchenMarland KitchenJagar was seen today for back pain.  Diagnoses and all orders for this visit:  Cervical radiculopathy -     predniSONE (DELTASONE) 20 MG tablet; Take 3 tablets for 3 days, take 2 tablets for 3 days, take 1 tablet for 3 days, take 1/2 tablet for 4 days. -     HYDROcodone-acetaminophen (NORCO/VICODIN) 5-325 MG tablet; Take 1 tablet by mouth every 8 (eight) hours as needed for up to 5 days for moderate pain. -     Ambulatory referral to Physical Therapy -     meloxicam (MOBIC) 15 MG tablet; Take 1 tablet (15 mg total) by mouth daily.  Cervical spondylosis   Sounds like C6-7 distrubution of cervical herniated disc. Stop medrol dose pack. Given larger prednisone taper. Hold on NSAIDs until stop prednisone. Stop tramadol. Given small quantity for severe pain of norco.  Gave exercises to start TID. Explained how posture can help with pain.  [facility] referral placed.  Continue with muscle relaxers. Try heat or ice with tens unit. Icy hot patches during the day.  Written out of work until Sunday.  If worsening or has strength changes or not improving will get MRI.   Marland Kitchen.PDMP reviewed during this encounter. No concerns.

## 2019-07-15 ENCOUNTER — Encounter: Payer: Self-pay | Admitting: Physician Assistant

## 2019-07-18 ENCOUNTER — Other Ambulatory Visit: Payer: Self-pay

## 2019-07-18 ENCOUNTER — Encounter: Payer: Self-pay | Admitting: Physician Assistant

## 2019-07-18 ENCOUNTER — Ambulatory Visit (INDEPENDENT_AMBULATORY_CARE_PROVIDER_SITE_OTHER): Payer: 59 | Admitting: Physician Assistant

## 2019-07-18 VITALS — BP 128/66 | HR 74 | Ht 71.0 in | Wt 339.0 lb

## 2019-07-18 DIAGNOSIS — M5412 Radiculopathy, cervical region: Secondary | ICD-10-CM

## 2019-07-18 DIAGNOSIS — M47812 Spondylosis without myelopathy or radiculopathy, cervical region: Secondary | ICD-10-CM | POA: Diagnosis not present

## 2019-07-18 NOTE — Progress Notes (Signed)
l °

## 2019-07-18 NOTE — Progress Notes (Addendum)
   Subjective:    Patient ID: Tanner Rodriguez, male    DOB: [birth year], 48 y.o.   MRN: [id number]  HPI  Pt is a 48 yo male with cervical radiculopathy who presents to the clinic for follow up. He reports to be doing 50 percent better. He continues on prednisone taper. He is taking mobic. He is icing and using tens. He is using prn flexeril and very sparingly norco. He has started going to chiropractor and helping some.insurance would not pay for PT.  He continues to have numbness and tingling running down arm. Denies any strength changes.   .. Active Ambulatory Problems    Diagnosis Date Noted  . Generalized anxiety disorder 12/23/2013  . Seborrheic dermatitis 05/12/2014  . OSA on CPAP 01/24/2015  . Hypertension 05/23/2019  . Chest tightness 05/23/2019  . Morbid obesity ([place]) 05/23/2019  . Rosacea 05/23/2019  . Cervical spondylosis 07/13/2019  . Cervical radiculopathy 07/13/2019   Resolved Ambulatory Problems    Diagnosis Date Noted  . No Resolved Ambulatory Problems   No Additional Past Medical History      Review of Systems See HPI.     Objective:   Physical Exam Vitals signs reviewed.  Constitutional:      Appearance: Normal appearance. He is obese.  Cardiovascular:     Rate and Rhythm: Normal rate and regular rhythm.  Pulmonary:     Effort: Pulmonary effort is normal.  Musculoskeletal:     Comments: Full ROM of left arm and neck.  No tenderness over cspine.  Tightness of the left upper back.  Strength 5/5.   Neurological:     General: No focal deficit present.     Mental Status: He is alert and oriented to person, place, and time.  Psychiatric:        Mood and Affect: Mood normal.           Assessment & Plan:  Marland KitchenMarland KitchenAziel was seen today for back pain.  Diagnoses and all orders for this visit:  Cervical radiculopathy  Cervical spondylosis   Pt is doing better.insurance would not pay for PT. 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 Report  back on Friday or Monday and will see if needs another week or ready to go back. Continue to do exercises three times a day. Follow up with any strength changes or weakness or worsening symptoms. If not improving need to get MRI.

## 2019-07-20 ENCOUNTER — Encounter: Payer: Self-pay | Admitting: Physician Assistant

## 2019-07-20 ENCOUNTER — Other Ambulatory Visit: Payer: Self-pay | Admitting: Physician Assistant

## 2019-07-20 DIAGNOSIS — I10 Essential (primary) hypertension: Secondary | ICD-10-CM

## 2019-07-21 ENCOUNTER — Encounter: Payer: Self-pay | Admitting: Physician Assistant

## 2019-11-07 ENCOUNTER — Other Ambulatory Visit: Payer: Self-pay | Admitting: Physician Assistant

## 2019-11-07 DIAGNOSIS — I10 Essential (primary) hypertension: Secondary | ICD-10-CM

## 2020-02-13 ENCOUNTER — Other Ambulatory Visit: Payer: Self-pay | Admitting: Physician Assistant

## 2020-02-13 DIAGNOSIS — I10 Essential (primary) hypertension: Secondary | ICD-10-CM

## 2020-02-13 MED ORDER — LISINOPRIL 10 MG PO TABS: 10.0000 mg | ORAL_TABLET | Freq: Every day | ORAL | 0 refills | Status: DC

## 2020-02-23 ENCOUNTER — Ambulatory Visit (INDEPENDENT_AMBULATORY_CARE_PROVIDER_SITE_OTHER): Payer: Self-pay | Admitting: Nurse Practitioner

## 2020-02-23 ENCOUNTER — Encounter: Payer: Self-pay | Admitting: Nurse Practitioner

## 2020-02-23 ENCOUNTER — Other Ambulatory Visit: Payer: Self-pay

## 2020-02-23 VITALS — BP 122/57 | HR 62 | Ht 71.0 in | Wt 352.0 lb

## 2020-02-23 DIAGNOSIS — E785 Hyperlipidemia, unspecified: Secondary | ICD-10-CM

## 2020-02-23 DIAGNOSIS — J3089 Other allergic rhinitis: Secondary | ICD-10-CM

## 2020-02-23 DIAGNOSIS — L219 Seborrheic dermatitis, unspecified: Secondary | ICD-10-CM

## 2020-02-23 DIAGNOSIS — I10 Essential (primary) hypertension: Secondary | ICD-10-CM

## 2020-02-23 LAB — COMPLETE METABOLIC PANEL WITH GFR
AG Ratio: 1.5 (calc) (ref 1.0–2.5)
ALT: 22 U/L (ref 9–46)
AST: 17 U/L (ref 10–35)
Albumin: 3.7 g/dL (ref 3.6–5.1)
Alkaline phosphatase (APISO): 61 U/L (ref 35–144)
BUN: 11 mg/dL (ref 7–25)
CO2: 24 mmol/L (ref 20–32)
Calcium: 8.8 mg/dL (ref 8.6–10.3)
Chloride: 107 mmol/L (ref 98–110)
Creat: 1.09 mg/dL (ref 0.70–1.33)
GFR, Est African American: 91 mL/min/{1.73_m2} (ref >=60)
GFR, Est Non African American: 79 mL/min/{1.73_m2} (ref >=60)
Globulin: 2.4 g/dL (calc) (ref 1.9–3.7)
Glucose, Bld: 98 mg/dL (ref 65–99)
Potassium: 4 mmol/L (ref 3.5–5.3)
Sodium: 138 mmol/L (ref 135–146)
Total Bilirubin: 0.5 mg/dL (ref 0.2–1.2)
Total Protein: 6.1 g/dL (ref 6.1–8.1)

## 2020-02-23 LAB — LIPID PANEL W/REFLEX DIRECT LDL
Cholesterol: 120 mg/dL (ref <200)
HDL: 42 mg/dL (ref >=40)
LDL Cholesterol (Calc): 61 mg/dL (calc)
Non-HDL Cholesterol (Calc): 78 mg/dL (calc) (ref <130)
Total CHOL/HDL Ratio: 2.9 (calc) (ref <5.0)
Triglycerides: 85 mg/dL (ref <150)

## 2020-02-23 MED ORDER — KETOCONAZOLE 2 % EX CREA: TOPICAL_CREAM | CUTANEOUS | 3 refills | Status: DC

## 2020-02-23 MED ORDER — ATORVASTATIN CALCIUM 40 MG PO TABS: 40.0000 mg | ORAL_TABLET | Freq: Every day | ORAL | 3 refills | Status: DC

## 2020-02-23 MED ORDER — LISINOPRIL 10 MG PO TABS: 10.0000 mg | ORAL_TABLET | Freq: Every day | ORAL | 1 refills | Status: DC

## 2020-02-23 MED ORDER — ALBUTEROL SULFATE HFA 108 (90 BASE) MCG/ACT IN AERS: 1.0000 | INHALATION_SPRAY | RESPIRATORY_TRACT | 1 refills | Status: DC | PRN

## 2020-02-23 NOTE — Progress Notes (Signed)
Established Patient Office Visit  Subjective:  Patient ID: Tanner Rodriguez, male    DOB: 1971-05-22  Age: 49 y.o. MRN: [id number]  CC:  Chief Complaint  Patient presents with  . Hypertension    HPI Tanner Rodriguez presents for follow-up for HTN and HLD and to discuss seasonal allergies.  HYPERTENSION / HYPERLIPIDEMIA Current Medications and Doses: Lisinopril 10mg  /d and atorvastatin 40mg /d Satisfied with current treatment? yes Duration of hypertension: months Medication compliance: excellent compliance BP monitoring frequency: not checking BP range:  BP medication side effects: no Cholesterol medication side effects: no Cholesterol supplements: none Duration of hyperlipidemia: months Aspirin: no Recent stressors: no Recurrent headaches: no Visual changes: no Palpitations: no Dyspnea: no Chest pain: no Lower extremity edema: no Dizzy/lightheaded: no  SEASONAL ALLERGIES Tanner Rodriguez is experiencing symptoms of seasonal allergies with some wheezing on occasion. He is currently taking OTC Zyrtec during the spring and fall, but this is not completely helpful with his wheezing. He tried his wife's albuterol inhaler and it helped with this significantly and he would like to try this for himself.   Past Medical History:  Diagnosis Date  . Generalized anxiety disorder 12/23/2013  . Hypertension     Past Surgical History:  Procedure Laterality Date  . NO PAST SURGERIES      Family History  Problem Relation Age of Onset  . Alcoholism Father   . Cancer Other        grandmother  . Heart attack Other   . Depression Other        grandfather  . Diabetes Mother   . Hyperlipidemia Mother   . Hypertension Other     Social History   Socioeconomic History  . Marital status: Married    Spouse name: Not on file  . Number of children: Not on file  . Years of education: Not on file  . Highest education level: Not on file  Occupational History  . Not on file  Tobacco Use  . Smoking  status: Heavy Tobacco Smoker    Packs/day: 0.50    Years: 35.00    Pack years: 17.50    Types: Cigarettes  . Smokeless tobacco: Current User    Types: Snuff  . Tobacco comment: smokes on the weekends,dips snuff during the week  Substance and Sexual Activity  . Alcohol use: Yes    Comment: rarely  . Drug use: No  . Sexual activity: Not Currently    Partners: Female  Other Topics Concern  . Not on file  Social History Narrative  . Not on file   Social Determinants of Health   Financial Resource Strain:   . Difficulty of Paying Living Expenses:   Food Insecurity:   . Worried About 05-11-1991 in the Last Year:   . 02/22/2014 in the Last Year:   Transportation Needs:   . Programme researcher, broadcasting/film/video (Medical):   Barista Lack of Transportation (Non-Medical):   Physical Activity:   . Days of Exercise per Week:   . Minutes of Exercise per Session:   Stress:   . Feeling of Stress :   Social Connections:   . Frequency of Communication with Friends and Family:   . Frequency of Social Gatherings with Friends and Family:   . Attends Religious Services:   . Active Member of Clubs or Organizations:   . Attends Freight forwarder Meetings:   Marland Kitchen Marital Status:   Intimate Partner Violence:   . Fear of Current or Ex-Partner:   .  Emotionally Abused:   Marland Kitchen Physically Abused:   . Sexually Abused:     Outpatient Medications Prior to Visit  Medication Sig Dispense Refill  . AMBULATORY NON FORMULARY MEDICATION Portable CPAP machine and supplies: 9 cm H2O with heat and humidity using a ResMed AirFit P10 mask. Dx: OSA 1 Units 0  . atorvastatin (LIPITOR) 40 MG tablet Take 1 tablet (40 mg total) by mouth daily. 90 tablet 3  . cyclobenzaprine (FLEXERIL) 10 MG tablet Take by mouth.    . desonide (DESOWEN) 0.05 % cream Apply topically 2 (two) times daily. 60 g 1  . ketoconazole (NIZORAL) 2 % cream APPLY EXTERNALLY TO THE AFFECTED AREA TWICE DAILY 120 g 3  . lisinopril (ZESTRIL) 10 MG tablet  Take 1 tablet (10 mg total) by mouth daily. NEEDS APPT/LABS 15 tablet 0  . meloxicam (MOBIC) 15 MG tablet Take 1 tablet (15 mg total) by mouth daily. 30 tablet 1  . metroNIDAZOLE (METROGEL) 1 % gel Apply topically daily. 45 g 0   No facility-administered medications prior to visit.    No Known Allergies  ROS Review of Systems  Constitutional: Negative for appetite change, chills, fatigue, fever and unexpected weight change.  HENT: Positive for postnasal drip, rhinorrhea and sneezing. Negative for congestion, ear pain, hearing loss, sinus pressure, sinus pain, sore throat and tinnitus.   Respiratory: Positive for wheezing. Negative for cough, chest tightness and shortness of breath.   Cardiovascular: Negative for chest pain, palpitations and leg swelling.  Gastrointestinal: Negative for abdominal distention and abdominal pain.  Musculoskeletal: Negative for arthralgias and myalgias.  Neurological: Negative for dizziness, tremors, syncope, weakness, light-headedness and headaches.  Psychiatric/Behavioral: Negative for behavioral problems, decreased concentration, sleep disturbance and suicidal ideas. The patient is not nervous/anxious.      Objective:    Physical Exam  Constitutional: He is oriented to person, place, and time. He appears well-developed and well-nourished.  HENT:  Head: Normocephalic and atraumatic.  Left Ear: External ear normal.  Eyes: Pupils are equal, round, and reactive to light. Conjunctivae and EOM are normal.  Cardiovascular: Normal rate, regular rhythm, normal heart sounds and intact distal pulses.  Pulmonary/Chest: Effort normal and breath sounds normal. No respiratory distress. He has no wheezes.  Abdominal: Soft. Bowel sounds are normal.  Musculoskeletal:        General: No edema. Normal range of motion.     Cervical back: Normal range of motion and neck supple.  Lymphadenopathy:    He has no cervical adenopathy.  Neurological: He is alert and oriented  to person, place, and time.  Skin: Skin is warm and dry.  Psychiatric: He has a normal mood and affect. His behavior is normal. Judgment and thought content normal.  Nursing note and vitals reviewed.   BP (!) 122/57   Pulse 62   Ht 5\' 11"  (1.803 m)   Wt (!) 352 lb (159.7 kg)   SpO2 98%   BMI 49.09 kg/m  Wt Readings from Last 3 Encounters:  02/23/20 (!) 352 lb (159.7 kg)  07/18/19 (!) 339 lb (153.8 kg)  07/13/19 (!) 340 lb (154.2 kg)     Health Maintenance Due  Topic Date Due  . COVID-19 Vaccine (1) Never done  . COLONOSCOPY  Never done    There are no preventive care reminders to display for this patient.  Lab Results  Component Value Date   TSH 0.92 05/20/2019   Lab Results  Component Value Date   WBC 7.7 05/20/2019   HGB  16.3 05/20/2019   HCT 45.8 05/20/2019   MCV 88.2 05/20/2019   PLT 227 05/20/2019   Lab Results  Component Value Date   NA 139 05/20/2019   K 4.5 05/20/2019   CO2 26 05/20/2019   GLUCOSE 102 (H) 05/20/2019   BUN 14 05/20/2019   CREATININE 1.11 05/20/2019   BILITOT 1.3 (H) 05/20/2019   AST 26 05/20/2019   ALT 28 05/20/2019   PROT 7.1 05/20/2019   CALCIUM 9.8 05/20/2019   Lab Results  Component Value Date   CHOL 201 (H) 05/20/2019   Lab Results  Component Value Date   HDL 37 (L) 05/20/2019   Lab Results  Component Value Date   LDLCALC 140 (H) 05/20/2019   Lab Results  Component Value Date   TRIG 120 05/20/2019   Lab Results  Component Value Date   CHOLHDL 5.4 (H) 05/20/2019   No results found for: HGBA1C    Assessment & Plan:   1. Essential hypertension Patient's blood pressure is well controlled on daily lisinopril.  He denies any side effects from the medication.  He is not taking his blood pressure home however today in the office is unsure if needed. No changes to medication today.  Refills provided for 90 days. Will obtain labs today to evaluate metabolic panel and lipids. Follow-up in 6 months - lisinopril  (ZESTRIL) 10 MG tablet; Take 1 tablet (10 mg total) by mouth daily.  Dispense: 90 tablet; Refill: 1 - atorvastatin (LIPITOR) 40 MG tablet; Take 1 tablet (40 mg total) by mouth daily.  Dispense: 90 tablet; Refill: 3 - COMPLETE METABOLIC PANEL WITH GFR - Lipid Panel w/reflex Direct LDL  2. Seborrheic dermatitis History of theoretic dermatitis well controlled with ketoconazole cream.  No concerns or changes noted today. Refill provided. - ketoconazole (NIZORAL) 2 % cream; APPLY EXTERNALLY TO THE AFFECTED AREA TWICE DAILY  Dispense: 120 g; Refill: 3  3. Hyperlipidemia, unspecified hyperlipidemia type Patient taking daily atorvastatin for hyperlipidemia.  He has only been on this medication for approximately 6 months so we will check a CMP and lipid panel today to evaluate the effectiveness of the medication. Patient encouraged to follow low-fat low-cholesterol diet.  Daily exercise of at least 30 minutes is also encouraged. 90-day refills provided. - lisinopril (ZESTRIL) 10 MG tablet; Take 1 tablet (10 mg total) by mouth daily.  Dispense: 90 tablet; Refill: 1 - atorvastatin (LIPITOR) 40 MG tablet; Take 1 tablet (40 mg total) by mouth daily.  Dispense: 90 tablet; Refill: 3 - COMPLETE METABOLIC PANEL WITH GFR - Lipid Panel w/reflex Direct LDL  4. Environmental and seasonal allergies Symptoms and presentation consistent with seasonal allergies due to environmental factors.  Plan to continue taking over-the-counter Zyrtec as needed for allergy symptoms.  Prescription provided for albuterol inhaler today with instructions to inhale 1 to 2 puffs every 4 hours as needed for wheezing.  If this is not effective in controlling symptoms we may need to do a spirometry on the patient to ensure that he has not developed asthma.  He has not a current smoker. Follow-up if symptoms worsen or fail to improve. - albuterol (VENTOLIN HFA) 108 (90 Base) MCG/ACT inhaler; Inhale 1-2 puffs into the lungs every 4 (four)  hours as needed for wheezing or shortness of breath.  Dispense: 6.7 g; Refill: 1  Follow-up: Return in about 6 months (around 08/25/2020) for HLD, HTN.    Orma Render, NP

## 2020-03-05 ENCOUNTER — Encounter: Payer: Self-pay | Admitting: Nurse Practitioner

## 2020-09-13 ENCOUNTER — Encounter: Payer: Self-pay | Admitting: Physician Assistant

## 2020-09-13 DIAGNOSIS — I10 Essential (primary) hypertension: Secondary | ICD-10-CM

## 2020-09-13 DIAGNOSIS — E785 Hyperlipidemia, unspecified: Secondary | ICD-10-CM

## 2020-09-17 MED ORDER — LISINOPRIL 10 MG PO TABS: 10.0000 mg | ORAL_TABLET | Freq: Every day | ORAL | 0 refills | Status: DC

## 2020-10-26 ENCOUNTER — Encounter: Payer: Self-pay | Admitting: Physician Assistant

## 2020-10-26 ENCOUNTER — Ambulatory Visit (INDEPENDENT_AMBULATORY_CARE_PROVIDER_SITE_OTHER): Payer: PRIVATE HEALTH INSURANCE | Admitting: Physician Assistant

## 2020-10-26 ENCOUNTER — Other Ambulatory Visit: Payer: Self-pay

## 2020-10-26 VITALS — BP 113/52 | HR 95 | Wt 334.1 lb

## 2020-10-26 DIAGNOSIS — G4733 Obstructive sleep apnea (adult) (pediatric): Secondary | ICD-10-CM

## 2020-10-26 DIAGNOSIS — Z1159 Encounter for screening for other viral diseases: Secondary | ICD-10-CM | POA: Diagnosis not present

## 2020-10-26 DIAGNOSIS — Z23 Encounter for immunization: Secondary | ICD-10-CM

## 2020-10-26 DIAGNOSIS — I10 Essential (primary) hypertension: Secondary | ICD-10-CM

## 2020-10-26 DIAGNOSIS — Z1211 Encounter for screening for malignant neoplasm of colon: Secondary | ICD-10-CM | POA: Diagnosis not present

## 2020-10-26 DIAGNOSIS — E785 Hyperlipidemia, unspecified: Secondary | ICD-10-CM | POA: Diagnosis not present

## 2020-10-26 DIAGNOSIS — M5412 Radiculopathy, cervical region: Secondary | ICD-10-CM

## 2020-10-26 DIAGNOSIS — Z9989 Dependence on other enabling machines and devices: Secondary | ICD-10-CM

## 2020-10-26 MED ORDER — LISINOPRIL 10 MG PO TABS: 10.0000 mg | ORAL_TABLET | Freq: Every day | ORAL | 1 refills | Status: DC

## 2020-10-26 NOTE — Progress Notes (Signed)
6 months check up, rx. Refill. Pt say he having muscles spasms on his rt, arm

## 2020-10-29 ENCOUNTER — Encounter: Payer: Self-pay | Admitting: Physician Assistant

## 2020-10-29 DIAGNOSIS — E785 Hyperlipidemia, unspecified: Secondary | ICD-10-CM

## 2020-10-29 HISTORY — DX: Hyperlipidemia, unspecified: E78.5

## 2020-10-29 NOTE — Progress Notes (Signed)
   Subjective:    Patient ID: Tanner Rodriguez, male    DOB: 28-Jun-1971, 50 y.o.   MRN: [id number]  HPI  Patient is a 50 year old obese male who presents to the clinic for 99-month follow-up.  Patient's past medical history is consistent with hypertension, OSA, hyperlipidemia, cervical radiculopathy.  He does need refills today.  Patient denies any chest pain, palpitations, headaches, vision changes.  He is taking his medication daily.  He is not checking his blood pressure at home.  Patient is using his CPAP nightly with great results.  He has no concerns.  Patient does have some intermittent cramping of his bilateral arms.  He does have some diagnosed cervical radiculopathy.  He is usually able to treat it with some stretching and IcyHot.  .. Active Ambulatory Problems    Diagnosis Date Noted  . Generalized anxiety disorder 12/23/2013  . Seborrheic dermatitis 05/12/2014  . OSA on CPAP 01/24/2015  . Essential hypertension 05/23/2019  . Chest tightness 05/23/2019  . Morbid obesity (HCC) 05/23/2019  . Rosacea 05/23/2019  . Cervical spondylosis 07/13/2019  . Cervical radiculopathy 07/13/2019  . Hyperlipidemia 10/29/2020   Resolved Ambulatory Problems    Diagnosis Date Noted  . No Resolved Ambulatory Problems   Past Medical History:  Diagnosis Date  . Hypertension         Review of Systems  All other systems reviewed and are negative.      Objective:   Physical Exam Vitals reviewed.  Constitutional:      Appearance: Normal appearance. He is obese.  Neck:     Vascular: No carotid bruit.  Cardiovascular:     Rate and Rhythm: Normal rate and regular rhythm.     Pulses: Normal pulses.     Heart sounds: Normal heart sounds.  Pulmonary:     Effort: Pulmonary effort is normal.     Breath sounds: Normal breath sounds.  Musculoskeletal:     Comments: NROM of neck. Upper bilateral extremities 5/5 strength and ROM.   Neurological:     General: No focal deficit present.      Mental Status: He is alert and oriented to person, place, and time.  Psychiatric:        Mood and Affect: Mood normal.           Assessment & Plan:  Marland KitchenMarland KitchenDiagnoses and all orders for this visit:  Essential hypertension -     lisinopril (ZESTRIL) 10 MG tablet; Take 1 tablet (10 mg total) by mouth daily. -     COMPLETE METABOLIC PANEL WITH GFR  Hyperlipidemia, unspecified hyperlipidemia type -     lisinopril (ZESTRIL) 10 MG tablet; Take 1 tablet (10 mg total) by mouth daily. -     COMPLETE METABOLIC PANEL WITH GFR  Colon cancer screening -     Cologuard  Encounter for hepatitis C screening test for low risk patient -     Hepatitis C Antibody  Need for Tdap vaccination -     Tdap vaccine greater than or equal to 7yo IM  Cervical radiculopathy   Declined smoking cessation Declined covid vaccine.   BP to goal. Refilled lisinopril. CMP ordered.   OsA-on CPAP.  Needs colonoscopy. Declined but agreed to cologuard. Ordered today.   Discussed intermittent radiculopathy pain as previously dx. Discussed exercises, stretches, NSAIDs, tens units, icy hot patches and further work up with sports medicine if continues to bother him. Considering possible injections and formal PT.

## 2020-11-06 ENCOUNTER — Other Ambulatory Visit: Payer: Self-pay | Admitting: Nurse Practitioner

## 2020-11-06 DIAGNOSIS — J3089 Other allergic rhinitis: Secondary | ICD-10-CM

## 2020-11-08 ENCOUNTER — Encounter: Payer: Self-pay | Admitting: Physician Assistant

## 2020-12-23 ENCOUNTER — Other Ambulatory Visit: Payer: Self-pay | Admitting: Nurse Practitioner

## 2020-12-23 DIAGNOSIS — J3089 Other allergic rhinitis: Secondary | ICD-10-CM

## 2020-12-24 MED ORDER — ALBUTEROL SULFATE HFA 108 (90 BASE) MCG/ACT IN AERS: 2.0000 | INHALATION_SPRAY | RESPIRATORY_TRACT | 0 refills | Status: DC | PRN

## 2021-02-20 ENCOUNTER — Other Ambulatory Visit: Payer: Self-pay | Admitting: Nurse Practitioner

## 2021-02-20 DIAGNOSIS — J3089 Other allergic rhinitis: Secondary | ICD-10-CM

## 2021-02-20 DIAGNOSIS — L219 Seborrheic dermatitis, unspecified: Secondary | ICD-10-CM

## 2021-02-21 NOTE — Telephone Encounter (Signed)
Last prescribed by Sara Beth.  

## 2021-04-24 ENCOUNTER — Other Ambulatory Visit: Payer: Self-pay | Admitting: Physician Assistant

## 2021-04-24 DIAGNOSIS — E785 Hyperlipidemia, unspecified: Secondary | ICD-10-CM

## 2021-04-24 DIAGNOSIS — I10 Essential (primary) hypertension: Secondary | ICD-10-CM

## 2021-04-24 MED ORDER — LISINOPRIL 10 MG PO TABS: 10.0000 mg | ORAL_TABLET | Freq: Every day | ORAL | 0 refills | Status: DC

## 2021-04-24 NOTE — Progress Notes (Signed)
Had to move appt needed BP refilled to make to next appt.

## 2021-04-26 ENCOUNTER — Ambulatory Visit: Payer: PRIVATE HEALTH INSURANCE | Admitting: Physician Assistant

## 2021-05-03 ENCOUNTER — Ambulatory Visit (INDEPENDENT_AMBULATORY_CARE_PROVIDER_SITE_OTHER): Payer: PRIVATE HEALTH INSURANCE | Admitting: Physician Assistant

## 2021-05-03 VITALS — BP 115/63 | HR 58 | Ht 71.0 in | Wt 322.0 lb

## 2021-05-03 DIAGNOSIS — N401 Enlarged prostate with lower urinary tract symptoms: Secondary | ICD-10-CM | POA: Insufficient documentation

## 2021-05-03 DIAGNOSIS — Z1159 Encounter for screening for other viral diseases: Secondary | ICD-10-CM

## 2021-05-03 DIAGNOSIS — Z1329 Encounter for screening for other suspected endocrine disorder: Secondary | ICD-10-CM

## 2021-05-03 DIAGNOSIS — R3912 Poor urinary stream: Secondary | ICD-10-CM

## 2021-05-03 DIAGNOSIS — G4733 Obstructive sleep apnea (adult) (pediatric): Secondary | ICD-10-CM

## 2021-05-03 DIAGNOSIS — Z1211 Encounter for screening for malignant neoplasm of colon: Secondary | ICD-10-CM

## 2021-05-03 DIAGNOSIS — Z125 Encounter for screening for malignant neoplasm of prostate: Secondary | ICD-10-CM

## 2021-05-03 DIAGNOSIS — F17200 Nicotine dependence, unspecified, uncomplicated: Secondary | ICD-10-CM

## 2021-05-03 DIAGNOSIS — E785 Hyperlipidemia, unspecified: Secondary | ICD-10-CM

## 2021-05-03 DIAGNOSIS — R079 Chest pain, unspecified: Secondary | ICD-10-CM

## 2021-05-03 DIAGNOSIS — Z0000 Encounter for general adult medical examination without abnormal findings: Secondary | ICD-10-CM

## 2021-05-03 DIAGNOSIS — I10 Essential (primary) hypertension: Secondary | ICD-10-CM | POA: Diagnosis not present

## 2021-05-03 DIAGNOSIS — Z9989 Dependence on other enabling machines and devices: Secondary | ICD-10-CM

## 2021-05-03 HISTORY — DX: Benign prostatic hyperplasia with lower urinary tract symptoms: N40.1

## 2021-05-03 MED ORDER — TAMSULOSIN HCL 0.4 MG PO CAPS: 0.4000 mg | ORAL_CAPSULE | Freq: Every day | ORAL | 3 refills | Status: DC

## 2021-05-03 NOTE — Progress Notes (Signed)
Subjective:    Patient ID: Tanner Rodriguez, male    DOB: 09-10-1971, 50 y.o.   MRN: [id number]  HPI Pt is a 50 yo obese male with HTN, OSA, HLD who presents to the clinic for medication refills and preventative follow up.   Pt is trying to be healthier and eat better but not having a lot of success. He feels like "it doesn't matter what he eats he doesn't lose weight". 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   He is using CPAP but seems like his is a part of the replacement recall.   He is having more prostate symptoms over the last year. He is not on medication.   He admits to some intermittent chest tightness and pain. No jaw pain or radiation of pain down arm. At times he notices with exertion but quickly resolves.   He has smoked for 35 plus years. Would like to quit but thinks until his wife quits he is not going to be able to.   .. Active Ambulatory Problems    Diagnosis Date Noted   Generalized anxiety disorder 12/23/2013   Seborrheic dermatitis 05/12/2014   OSA on CPAP 01/24/2015   Essential hypertension 05/23/2019   Chest tightness 05/23/2019   Morbid obesity (HCC) 05/23/2019   Rosacea 05/23/2019   Cervical spondylosis 07/13/2019   Cervical radiculopathy 07/13/2019   Hyperlipidemia 10/29/2020   Benign prostatic hyperplasia with weak urinary stream 05/03/2021   Resolved Ambulatory Problems    Diagnosis Date Noted   No Resolved Ambulatory Problems   Past Medical History:  Diagnosis Date   Hypertension      Review of Systems See HPI.     Objective:   Physical Exam BP 115/63   Pulse (!) 58   Ht 5\' 11"  (1.803 m)   Wt (!) 322 lb (146.1 kg)   SpO2 98%   BMI 44.91 kg/m   General Appearance:    Alert, cooperative, no distress, appears stated age, morbidly obese.  Head:    Normocephalic, without obvious abnormality, atraumatic  Eyes:    PERRL, conjunctiva/corneas clear, EOM's intact, fundi    benign, both eyes       Ears:    Normal TM's and external  ear canals, both ears  Nose:   Nares normal, septum midline, mucosa normal, no drainage    or sinus tenderness  Throat:   Lips, mucosa, and tongue normal; teeth and gums normal  Neck:   Supple, symmetrical, trachea midline, no adenopathy;       thyroid:  No enlargement/tenderness/nodules; no carotid   bruit or JVD  Back:     Symmetric, no curvature, ROM normal, no CVA tenderness  Lungs:     Clear to auscultation bilaterally, respirations unlabored  Chest wall:    No tenderness or deformity  Heart:    Regular rate and rhythm, S1 and S2 normal, no murmur, rub   or gallop  Abdomen:     Soft, non-tender, bowel sounds active all four quadrants,    no masses, no organomegaly        Extremities:   Extremities normal, atraumatic, no cyanosis or edema  Pulses:   2+ and symmetric all extremities  Skin:   Skin color, texture, turgor normal, no rashes or lesions  Lymph nodes:   Cervical, supraclavicular, and axillary nodes normal  Neurologic:   CNII-XII intact. Normal strength, sensation and reflexes      throughout     . Depression screen [facility] 2/9  05/03/2021 02/23/2020 05/20/2019  Decreased Interest 0 0 0  Down, Depressed, Hopeless 0 0 0  PHQ - 2 Score 0 0 0  Altered sleeping 1 0 1  Tired, decreased energy 1 0 1  Change in appetite 0 0 0  Feeling bad or failure about yourself  0 0 0  Trouble concentrating 0 0 0  Moving slowly or fidgety/restless 0 0 0  Suicidal thoughts 0 0 0  PHQ-9 Score 2 0 2  Difficult doing work/chores Not difficult at all Not difficult at all Not difficult at all   .Marland Kitchen GAD 7 : Generalized Anxiety Score 05/03/2021 02/23/2020 05/20/2019  Nervous, Anxious, on Edge 0 0 3  Control/stop worrying 0 0 0  Worry too much - different things 0 0 0  Trouble relaxing 0 0 0  Restless 0 0 0  Easily annoyed or irritable 0 0 0  Afraid - awful might happen 0 0 0  Total GAD 7 Score 0 0 3  Anxiety Difficulty Not difficult at all Not difficult at all Not difficult at all    .Marland Kitchen  RO-AUA  SYMPTOM     Row Name 05/03/21 1000         During the last Month   Sensation of Bladder not Empty Less than half the time     Urinate<2 hours after last More than half the time     Mult. stop/start when voiding Less than half the time     Difficult to postpone voiding Less than half the time     Weak urinary stream About half the time     Push/strain to begin urination Less than half the time     Times per night up to urinate Less than 1 time in 5           OTHER     Total Score 16                Assessment & Plan:  Marland KitchenMarland KitchenZev was seen today for hypertension.  Diagnoses and all orders for this visit:  Preventative health care -     PSA -     TSH -     Lipid Panel w/reflex Direct LDL -     COMPLETE METABOLIC PANEL WITH GFR -     CBC with Differential/Platelet  Hyperlipidemia, unspecified hyperlipidemia type -     Lipid Panel w/reflex Direct LDL -     lisinopril (ZESTRIL) 10 MG tablet; Take 1 tablet (10 mg total) by mouth daily. -     atorvastatin (LIPITOR) 40 MG tablet; Take 1 tablet (40 mg total) by mouth daily.  Essential hypertension -     COMPLETE METABOLIC PANEL WITH GFR -     lisinopril (ZESTRIL) 10 MG tablet; Take 1 tablet (10 mg total) by mouth daily. -     atorvastatin (LIPITOR) 40 MG tablet; Take 1 tablet (40 mg total) by mouth daily.  Thyroid disorder screen -     TSH  Screening PSA (prostate specific antigen) -     PSA  OSA on CPAP  Morbid obesity (HCC)  Encounter for hepatitis C screening test for low risk patient -     Hepatitis C Antibody  Benign prostatic hyperplasia with weak urinary stream -     tamsulosin (FLOMAX) 0.4 MG CAPS capsule; Take 1 capsule (0.4 mg total) by mouth daily after supper.  Colon cancer screening -     Ambulatory referral to Gastroenterology  Current smoker -  Ambulatory Referral for Lung Cancer Scre  .Marland KitchenStart a regular exercise program and make sure you are eating a healthy diet Try to eat 4 servings of dairy  a day or take a calcium supplement (500mg  twice a day). Fasting labs ordered.  PHQ/GAD to goal.  AUA high at 16. PSA ordered. Started flomax. Follow up in 6 months.  BP to goal. Refilled lisinopril.  Info given for CPAP recall.  Get colonoscopy. Referral placed.  Shingles vaccine declined.  Covid vaccine declined.   Smoker for 35 plus years over 50 will we submit to be screened for CT chest screening. Declines medication intervention for smoking cessation.   Pt high risk for CAD and CV event.  Stress test ordered for CP.  Lipid ordered.   .Discussed low carb diet with 1500 calories and 80g of protein.  Exercising at least 150 minutes a week.  My Fitness Pal could be a Marland Kitchen.  Consider follow up for weight loss medications.

## 2021-05-03 NOTE — Patient Instructions (Addendum)
Register your CPAP device here to get a replacement:  [email or web address]  Start flomax.  GET colonoscopy.  Will get stress test and see if qualifies for low dose lung CT screen.

## 2021-05-04 NOTE — Progress Notes (Signed)
Terion,   Prostate enzyme is nice and low. GREAT news. Lets see how flomax does for your prostate symptoms.  Thyroid is great.  Kidney and liver looks good.  Normal hemoglobin.  Fasting sugar just a little elevated. Lets add a1c to further evaluate.  Cholesterol looks great. HDL could be a little better and can increase with exercise.

## 2021-05-05 ENCOUNTER — Encounter: Payer: Self-pay | Admitting: Physician Assistant

## 2021-05-05 DIAGNOSIS — R079 Chest pain, unspecified: Secondary | ICD-10-CM | POA: Insufficient documentation

## 2021-05-05 MED ORDER — ATORVASTATIN CALCIUM 40 MG PO TABS: 40.0000 mg | ORAL_TABLET | Freq: Every day | ORAL | 3 refills | Status: DC

## 2021-05-05 MED ORDER — LISINOPRIL 10 MG PO TABS: 10.0000 mg | ORAL_TABLET | Freq: Every day | ORAL | 1 refills | Status: DC

## 2021-05-07 NOTE — Progress Notes (Signed)
Hep C negative.

## 2021-05-10 ENCOUNTER — Other Ambulatory Visit: Payer: Self-pay | Admitting: Physician Assistant

## 2021-05-10 DIAGNOSIS — J3089 Other allergic rhinitis: Secondary | ICD-10-CM

## 2021-05-10 LAB — HEPATITIS C ANTIBODY
Hepatitis C Ab: NONREACTIVE
SIGNAL TO CUT-OFF: 0.01 (ref <1.00)

## 2021-05-10 LAB — CBC WITH DIFFERENTIAL/PLATELET
Absolute Monocytes: 462 cells/uL (ref 200–950)
Basophils Absolute: 52 cells/uL (ref 0–200)
Basophils Relative: 0.8 %
Eosinophils Absolute: 98 cells/uL (ref 15–500)
Eosinophils Relative: 1.5 %
HCT: 44.3 % (ref 38.5–50.0)
Hemoglobin: 15 g/dL (ref 13.2–17.1)
Lymphs Abs: 1671 cells/uL (ref 850–3900)
MCH: 30.5 pg (ref 27.0–33.0)
MCHC: 33.9 g/dL (ref 32.0–36.0)
MCV: 90 fL (ref 80.0–100.0)
MPV: 11.1 fL (ref 7.5–12.5)
Monocytes Relative: 7.1 %
Neutro Abs: 4219 cells/uL (ref 1500–7800)
Neutrophils Relative %: 64.9 %
Platelets: 186 10*3/uL (ref 140–400)
RBC: 4.92 10*6/uL (ref 4.20–5.80)
RDW: 12.5 % (ref 11.0–15.0)
Total Lymphocyte: 25.7 %
WBC: 6.5 10*3/uL (ref 3.8–10.8)

## 2021-05-10 LAB — COMPLETE METABOLIC PANEL WITH GFR
AG Ratio: 1.6 (calc) (ref 1.0–2.5)
ALT: 23 U/L (ref 9–46)
AST: 19 U/L (ref 10–35)
Albumin: 4.1 g/dL (ref 3.6–5.1)
Alkaline phosphatase (APISO): 59 U/L (ref 35–144)
BUN: 14 mg/dL (ref 7–25)
CO2: 24 mmol/L (ref 20–32)
Calcium: 9.2 mg/dL (ref 8.6–10.3)
Chloride: 108 mmol/L (ref 98–110)
Creat: 1 mg/dL (ref 0.70–1.30)
Globulin: 2.5 g/dL (calc) (ref 1.9–3.7)
Glucose, Bld: 103 mg/dL — ABNORMAL HIGH (ref 65–99)
Potassium: 4.2 mmol/L (ref 3.5–5.3)
Sodium: 139 mmol/L (ref 135–146)
Total Bilirubin: 1.2 mg/dL (ref 0.2–1.2)
Total Protein: 6.6 g/dL (ref 6.1–8.1)
eGFR: 91 mL/min/{1.73_m2} (ref >=60)

## 2021-05-10 LAB — LIPID PANEL W/REFLEX DIRECT LDL
Cholesterol: 120 mg/dL (ref <200)
HDL: 38 mg/dL — ABNORMAL LOW (ref >=40)
LDL Cholesterol (Calc): 62 mg/dL (calc)
Non-HDL Cholesterol (Calc): 82 mg/dL (calc) (ref <130)
Total CHOL/HDL Ratio: 3.2 (calc) (ref <5.0)
Triglycerides: 117 mg/dL (ref <150)

## 2021-05-10 LAB — PSA: PSA: 0.39 ng/mL (ref <=4.00)

## 2021-05-10 LAB — HEMOGLOBIN A1C W/OUT EAG: Hgb A1c MFr Bld: 5.1 % of total Hgb (ref <5.7)

## 2021-05-10 LAB — TSH: TSH: 0.93 mIU/L (ref 0.40–4.50)

## 2021-05-13 NOTE — Progress Notes (Signed)
A1C is 5.1 and looks great. No sign of diabetes.

## 2021-05-17 ENCOUNTER — Telehealth (HOSPITAL_COMMUNITY): Payer: Self-pay | Admitting: *Deleted

## 2021-05-17 NOTE — Telephone Encounter (Signed)
Close encounter 

## 2021-05-22 ENCOUNTER — Other Ambulatory Visit: Payer: Self-pay

## 2021-05-22 ENCOUNTER — Ambulatory Visit (HOSPITAL_COMMUNITY)
Admission: RE | Admit: 2021-05-22 | Discharge: 2021-05-22 | Disposition: A | Discharge status: Home / Self Care | Payer: PRIVATE HEALTH INSURANCE | Source: Ambulatory Visit | Attending: Cardiovascular Disease | Admitting: Cardiovascular Disease

## 2021-05-22 ENCOUNTER — Encounter: Payer: Self-pay | Admitting: Physician Assistant

## 2021-05-22 DIAGNOSIS — I10 Essential (primary) hypertension: Secondary | ICD-10-CM | POA: Diagnosis present

## 2021-05-22 DIAGNOSIS — R079 Chest pain, unspecified: Secondary | ICD-10-CM

## 2021-05-22 DIAGNOSIS — Z9989 Dependence on other enabling machines and devices: Secondary | ICD-10-CM | POA: Diagnosis present

## 2021-05-22 DIAGNOSIS — E785 Hyperlipidemia, unspecified: Secondary | ICD-10-CM

## 2021-05-22 DIAGNOSIS — F17200 Nicotine dependence, unspecified, uncomplicated: Secondary | ICD-10-CM

## 2021-05-22 DIAGNOSIS — G4733 Obstructive sleep apnea (adult) (pediatric): Secondary | ICD-10-CM | POA: Diagnosis present

## 2021-05-22 LAB — EXERCISE TOLERANCE TEST
Estimated workload: 7 METS
Exercise duration (min): 6 min
Exercise duration (sec): 0 s
MPHR: 169 {beats}/min
Peak HR: 157 {beats}/min
Percent HR: 92 %
Rest HR: 68 {beats}/min

## 2021-05-23 NOTE — Telephone Encounter (Signed)
Patient scheduled.

## 2021-05-23 NOTE — Progress Notes (Signed)
Normal stress test. Great news.

## 2021-05-24 ENCOUNTER — Other Ambulatory Visit: Payer: Self-pay

## 2021-05-24 ENCOUNTER — Encounter: Payer: Self-pay | Admitting: Physician Assistant

## 2021-05-24 ENCOUNTER — Ambulatory Visit (INDEPENDENT_AMBULATORY_CARE_PROVIDER_SITE_OTHER): Payer: PRIVATE HEALTH INSURANCE | Admitting: Physician Assistant

## 2021-05-24 VITALS — BP 136/65 | HR 72 | Temp 98.8°F | Ht 71.0 in | Wt 319.0 lb

## 2021-05-24 DIAGNOSIS — K645 Perianal venous thrombosis: Secondary | ICD-10-CM

## 2021-05-24 HISTORY — DX: Perianal venous thrombosis: K64.5

## 2021-05-24 MED ORDER — HYDROCODONE-ACETAMINOPHEN 5-325 MG PO TABS: 1.0000 | ORAL_TABLET | Freq: Four times a day (QID) | ORAL | 0 refills | Status: AC | PRN

## 2021-05-24 MED ORDER — HYDROCODONE-ACETAMINOPHEN 5-325 MG PO TABS: 1.0000 | ORAL_TABLET | Freq: Four times a day (QID) | ORAL | 0 refills | Status: DC | PRN

## 2021-05-24 NOTE — Patient Instructions (Signed)
Plan: 1. Very frequent Sitz baths. 2. Colace tid prn and increase fluids to prevent constipation.   3. Call or return to clinic prn if these symptoms worsen or fail to improve as anticipated  Surgical Procedures for Hemorrhoids, Care After This sheet gives you information about how to care for yourself after your procedure. Your health care provider may also give you more specific instructions. If you have problems or questions, contact your health careprovider. What can I expect after the procedure? After the procedure, it is common to have: Rectal pain. Pain when you are having a bowel movement. Slight rectal bleeding. This is more likely to happen with the first bowel movement after surgery. Follow these instructions at home: Medicines Take over-the-counter and prescription medicines only as told by your health care provider. If you were prescribed an antibiotic medicine, use it as told by your health care provider. Do not stop using the antibiotic even if your condition improves. Ask your health care provider if the medicine prescribed to you requires you to avoid driving or using heavy machinery. Use a stool softener or a bulk laxative as told by your health care provider. Eating and drinking Follow instructions from your health care provider about what to eat or drink after your procedure. You may need to take actions to prevent or treat constipation, such as: Drink enough fluid to keep your urine pale yellow. Take over-the-counter or prescription medicines. Eat foods that are high in fiber, such as beans, whole grains, and fresh fruits and vegetables. Limit foods that are high in fat and processed sugars, such as fried or sweet foods. Activity  Rest as told by your health care provider. Avoid sitting for a long time without moving. Get up to take short walks every 1-2 hours. This is important to improve blood flow and breathing. Ask for help if you feel weak or unsteady. Return to  your normal activities as told by your health care provider. Ask your health care provider what activities are safe for you. Do not lift anything that is heavier than 10 lb (4.5 kg), or the limit that you are told, until your health care provider says that it is safe. Do not strain to have a bowel movement. Do not spend a long time sitting on the toilet.  General instructions  Take warm sitz baths for 15-20 minutes, 2-3 times a day to relieve soreness or itching and to keep the rectal area clean. Apply ice packs to the area to reduce swelling and pain. Do not drive for 24 hours if you were given a sedative during your procedure. Keep all follow-up visits as told by your health care provider. This is important.  Contact a health care provider if: Your pain medicine is not helping. You have a fever or chills. You have bad smelling drainage. You have a lot of swelling. You become constipated. You have trouble passing urine. Get help right away if: You have very bad rectal pain. You have heavy bleeding from your rectum. Summary After the procedure, it is common to have pain and slight rectal bleeding. Take warm sitz baths for 15-20 minutes, 2-3 times a day to relieve soreness or itching and to keep the rectal area clean. Avoid straining when having a bowel movement. Eat foods that are high in fiber, such as beans, whole grains, and fresh fruits and vegetables. Take over-the-counter and prescription medicines only as told by your health care provider. This information is not intended to replace advice given  to you by your health care provider. Make sure you discuss any questions you have with your healthcare provider. Document Revised: 03/23/2019 Document Reviewed: 08/24/2018 Elsevier Patient Education  2022 [employer].

## 2021-05-24 NOTE — Progress Notes (Signed)
   Subjective:    Patient ID: Tanner Rodriguez, male    DOB: Jan 28, 1971, 50 y.o.   MRN: [id number]  HPI Pt is a 50 yo male with painful knot on anus for 4 days. He has been sitting on a donut pillow but becoming more painful. Denies any constipation or diarrhea. He is a Naval architect and sitting all day. Denies any melena or hematochezia. He is using prep H but no benefit. He is in a lot of pain.   .. Active Ambulatory Problems    Diagnosis Date Noted   Generalized anxiety disorder 12/23/2013   Seborrheic dermatitis 05/12/2014   OSA on CPAP 01/24/2015   Essential hypertension 05/23/2019   Chest tightness 05/23/2019   Morbid obesity (HCC) 05/23/2019   Rosacea 05/23/2019   Cervical spondylosis 07/13/2019   Cervical radiculopathy 07/13/2019   Hyperlipidemia 10/29/2020   Benign prostatic hyperplasia with weak urinary stream 05/03/2021   Chest pain 05/05/2021   External thrombosed hemorrhoids 05/24/2021   Resolved Ambulatory Problems    Diagnosis Date Noted   No Resolved Ambulatory Problems   Past Medical History:  Diagnosis Date   Hypertension      Review of Systems See HPI.     Objective:   Physical Exam  Thrombosed external hemorrhoid.       Assessment & Plan:  Marland KitchenMarland KitchenDayle was seen today for rectal pain.  Diagnoses and all orders for this visit:  External thrombosed hemorrhoids -     HYDROcodone-acetaminophen (NORCO/VICODIN) 5-325 MG tablet; Take 1 tablet by mouth every 6 (six) hours as needed for up to 5 days for moderate pain.  Other orders -     Discontinue: HYDROcodone-acetaminophen (NORCO/VICODIN) 5-325 MG tablet; Take 1 tablet by mouth every 6 (six) hours as needed for up to 5 days for moderate pain.   Incision Thrombosed Hemorrhoid Procedure Note  Pre-operative Diagnosis: Thrombosed external hemorrhoid  Post-operative Diagnosis: Thrombosed external hemorrhoid  Locations:anus  Indications: Painful external hemorrhoid. Explained surgical vs conservative  treatment; surgical incision and drainage of clot usually works quickly to reduce pain and shorten healing time, but is uncomfortable to perform.  After discussion, the patient wishes to proceed with this procedure.  Anesthesia: Lidocaine 1% without epinephrine without added sodium bicarbonate  Procedure Details  After adequate anesthesia, an elliptical incision was made, and the visible clot was extruded with curved hemostat. This was well tolerated. Bulky dressing is applied.  Complications: none.  Plan: 1. Very frequent Sitz baths. 2. Colace tid prn and increase fluids to prevent constipation.   3. Call or return to clinic prn if these symptoms worsen or fail to improve as anticipated

## 2021-06-03 ENCOUNTER — Telehealth: Payer: Self-pay | Admitting: Neurology

## 2021-06-03 NOTE — Telephone Encounter (Signed)
Called patient to make appt. He is in [place] at the moment and won't be back this week. He wants to finish antibiotics and follow up if no better.   Ivett Luebbe - FYI.

## 2021-06-03 NOTE — Telephone Encounter (Signed)
-----   Message from Jomarie Longs, [place] sent at 06/03/2021 11:41 AM EDT ----- Regarding: FW: On call report Please follow up with patient and bring in on Wednesday if we need to take a look at it.   ----- Message ----- From: Tollie Eth, NP Sent: 06/01/2021  11:30 AM EDT To: Jomarie Longs, PA-C Subject: On call report                                 Tanner Rodriguez called the on call nurse line with concerns of infection to the rectal area following hemorrhoidectomy on 05/27/21.  He endorsed purulent drainage from the incision site while wiping. With moderate tenderness to the area. No fever, chills, nausea, vomiting, diarrhea, abdominal pain, or bleeding noted.  Treatment sent for suspected infection for Augmentin 875mg  BID x 5d to [facility] in .  Instructions for monitoring and when to seek emergency/after hours care provided. Recommend he call the office first thing Monday to discuss with triage if he should be seen for re-evaluation.

## 2021-06-10 ENCOUNTER — Ambulatory Visit (INDEPENDENT_AMBULATORY_CARE_PROVIDER_SITE_OTHER): Payer: PRIVATE HEALTH INSURANCE | Admitting: Physician Assistant

## 2021-06-10 ENCOUNTER — Other Ambulatory Visit: Payer: Self-pay

## 2021-06-10 ENCOUNTER — Encounter: Payer: Self-pay | Admitting: Physician Assistant

## 2021-06-10 VITALS — BP 105/41 | HR 76 | Ht 71.0 in | Wt 320.0 lb

## 2021-06-10 DIAGNOSIS — K645 Perianal venous thrombosis: Secondary | ICD-10-CM

## 2021-06-10 DIAGNOSIS — K625 Hemorrhage of anus and rectum: Secondary | ICD-10-CM

## 2021-06-10 DIAGNOSIS — K649 Unspecified hemorrhoids: Secondary | ICD-10-CM | POA: Insufficient documentation

## 2021-06-10 DIAGNOSIS — Z1211 Encounter for screening for malignant neoplasm of colon: Secondary | ICD-10-CM | POA: Diagnosis not present

## 2021-06-10 HISTORY — DX: Unspecified hemorrhoids: K64.9

## 2021-06-10 HISTORY — DX: Hemorrhage of anus and rectum: K62.5

## 2021-06-10 MED ORDER — HYDROCORTISONE ACETATE 25 MG RE SUPP
25.0000 mg | Freq: Two times a day (BID) | RECTAL | 1 refills | Status: DC | PRN
Start: 2021-06-10 — End: 2023-05-15

## 2021-06-10 NOTE — Patient Instructions (Signed)
Hemorrhoids Hemorrhoids are swollen veins that may develop: In the butt (rectum). These are called internal hemorrhoids. Around the opening of the butt (anus). These are called external hemorrhoids. Hemorrhoids can cause pain, itching, or bleeding. Most of the time, they do not cause serious problems. They usually get better with diet changes, lifestylechanges, and other home treatments. What are the causes? This condition may be caused by: Having trouble pooping (constipation). Pushing hard (straining) to poop. Watery poop (diarrhea). Pregnancy. Being very overweight (obese). Sitting for long periods of time. Heavy lifting or other activity that causes you to strain. Anal sex. Riding a bike for a long period of time. What are the signs or symptoms? Symptoms of this condition include: Pain. Itching or soreness in the butt. Bleeding from the butt. Leaking poop. Swelling in the area. One or more lumps around the opening of your butt. How is this diagnosed? A doctor can often diagnose this condition by looking at the affected area. The doctor may also: Do an exam that involves feeling the area with a gloved hand (digital rectal exam). Examine the area inside your butt using a small tube (anoscope). Order blood tests. This may be done if you have lost a lot of blood. Have you get a test that involves looking inside the colon using a flexible tube with a camera on the end (sigmoidoscopy or colonoscopy). How is this treated? This condition can usually be treated at home. Your doctor may tell you to change what you eat, make lifestyle changes, or try home treatments. If these do not help, procedures can be done to remove the hemorrhoids or make them smaller. These may involve: Placing rubber bands at the base of the hemorrhoids to cut off their blood supply. Injecting medicine into the hemorrhoids to shrink them. Shining a type of light energy onto the hemorrhoids to cause them to fall  off. Doing surgery to remove the hemorrhoids or cut off their blood supply. Follow these instructions at home: Eating and drinking  Eat foods that have a lot of fiber in them. These include whole grains, beans, nuts, fruits, and vegetables. Ask your doctor about taking products that have added fiber (fibersupplements). Reduce the amount of fat in your diet. You can do this by: Eating low-fat dairy products. Eating less red meat. Avoiding processed foods. Drink enough fluid to keep your pee (urine) pale yellow.  Managing pain and swelling  Take a warm-water bath (sitz bath) for 20 minutes to ease pain. Do this 3-4 times a day. You may do this in a bathtub or using a portable sitz bath that fits over the toilet. If told, put ice on the painful area. It may be helpful to use ice between your warm baths. Put ice in a plastic bag. Place a towel between your skin and the bag. Leave the ice on for 20 minutes, 2-3 times a day.  General instructions Take over-the-counter and prescription medicines only as told by your doctor. Medicated creams and medicines may be used as told. Exercise often. Ask your doctor how much and what kind of exercise is best for you. Go to the bathroom when you have the urge to poop. Do not wait. Avoid pushing too hard when you poop. Keep your butt dry and clean. Use wet toilet paper or moist towelettes after pooping. Do not sit on the toilet for a long time. Keep all follow-up visits as told by your doctor. This is important. Contact a doctor if you: Have pain   and swelling that do not get better with treatment or medicine. Have trouble pooping. Cannot poop. Have pain or swelling outside the area of the hemorrhoids. Get help right away if you have: Bleeding that will not stop. Summary Hemorrhoids are swollen veins in the butt or around the opening of the butt. They can cause pain, itching, or bleeding. Eat foods that have a lot of fiber in them. These include  whole grains, beans, nuts, fruits, and vegetables. Take a warm-water bath (sitz bath) for 20 minutes to ease pain. Do this 3-4 times a day. This information is not intended to replace advice given to you by your health care provider. Make sure you discuss any questions you have with your healthcare provider. Document Revised: 10/14/2018 Document Reviewed: 02/25/2018 Elsevier Patient Education  2022 Elsevier Inc.  

## 2021-06-10 NOTE — Progress Notes (Signed)
   Subjective:    Patient ID: Cleland Simkins, male    DOB: 18-Jun-1971, 50 y.o.   MRN: [id number]  HPI Pt is a 50 yo obese male who presents to the clinic for follow up on hemorrhoidectomy 2 1/2 weeks ago. He is not in pain but does feel some pressure in rectal area. Having some intermittent bright red blood with bowel movements. Not had colonoscopy. Reports bowel movements are soft and no straining.   .. Active Ambulatory Problems    Diagnosis Date Noted   Generalized anxiety disorder 12/23/2013   Seborrheic dermatitis 05/12/2014   OSA on CPAP 01/24/2015   Essential hypertension 05/23/2019   Chest tightness 05/23/2019   Morbid obesity (HCC) 05/23/2019   Rosacea 05/23/2019   Cervical spondylosis 07/13/2019   Cervical radiculopathy 07/13/2019   Hyperlipidemia 10/29/2020   Benign prostatic hyperplasia with weak urinary stream 05/03/2021   Chest pain 05/05/2021   External thrombosed hemorrhoids 05/24/2021   Hemorrhoids 06/10/2021   Bright red blood per rectum 06/10/2021   Resolved Ambulatory Problems    Diagnosis Date Noted   No Resolved Ambulatory Problems   Past Medical History:  Diagnosis Date   Hypertension      Review of Systems See HPI.     Objective:   Physical Exam Vitals reviewed. Exam conducted with a chaperone present.  Cardiovascular:     Rate and Rhythm: Normal rate.  Genitourinary:    Rectum: Guaiac result negative.     Comments: Evidence of external thrombosed or non thrombosed hemorrhoids on today's exam.  Neurological:     General: No focal deficit present.     Mental Status: He is oriented to person, place, and time.  Psychiatric:        Mood and Affect: Mood normal.          Assessment & Plan:  Marland KitchenMarland KitchenRai was seen today for follow-up.  Diagnoses and all orders for this visit:  External thrombosed hemorrhoids -     Ambulatory referral to Gastroenterology -     hydrocortisone (ANUSOL-HC) 25 MG suppository; Place 1 suppository (25 mg total)  rectally 2 (two) times daily as needed for hemorrhoids.  Colon cancer screening -     Ambulatory referral to Gastroenterology  Hemorrhoids, unspecified hemorrhoid type -     Ambulatory referral to Gastroenterology -     hydrocortisone (ANUSOL-HC) 25 MG suppository; Place 1 suppository (25 mg total) rectally 2 (two) times daily as needed for hemorrhoids.  Bright red blood per rectum -     Ambulatory referral to Gastroenterology -     hydrocortisone (ANUSOL-HC) 25 MG suppository; Place 1 suppository (25 mg total) rectally 2 (two) times daily as needed for hemorrhoids.   Reassured patient thrombosed hemorrhoid has resolved. No signs of infection. Could be some internal hemorrhoids. Given anusol suppository.  Needs referral for GI to get colonoscopy and better evaluation. Discussed prevention of hemorrhoids with limited straining and soft bowel movements. HO given. Consider colace if needed as a stool softener. Drink plenty of water.

## 2021-06-27 ENCOUNTER — Encounter: Payer: Self-pay | Admitting: Physician Assistant

## 2021-06-28 ENCOUNTER — Encounter: Payer: Self-pay | Admitting: Family Medicine

## 2021-06-28 ENCOUNTER — Other Ambulatory Visit: Payer: Self-pay

## 2021-06-28 ENCOUNTER — Ambulatory Visit (INDEPENDENT_AMBULATORY_CARE_PROVIDER_SITE_OTHER): Payer: PRIVATE HEALTH INSURANCE | Admitting: Family Medicine

## 2021-06-28 VITALS — BP 136/65 | HR 65 | Ht 71.0 in | Wt 317.0 lb

## 2021-06-28 DIAGNOSIS — K645 Perianal venous thrombosis: Secondary | ICD-10-CM

## 2021-06-28 DIAGNOSIS — K6289 Other specified diseases of anus and rectum: Secondary | ICD-10-CM

## 2021-06-28 MED ORDER — SULFAMETHOXAZOLE-TRIMETHOPRIM 800-160 MG PO TABS: 1.0000 | ORAL_TABLET | Freq: Two times a day (BID) | ORAL | 0 refills | Status: DC

## 2021-06-28 NOTE — Progress Notes (Signed)
Acute Office Visit  Subjective:    Patient ID: Tanner Rodriguez, male    DOB: [birth year], 50 y.o.   MRN: [id number]  Chief Complaint  Patient presents with   Hemorrhoids    HPI Patient is in today for rectal pain and drainage.  He was seen on August 5 for an external thrombosed hemorrhoid.  The hemorrhoid was lanced that day.  He ended up calling on call that weekend and was placed on an antibiotic.  He was treated with Augmentin for 5 days.  He then came back in to see his PCP on August 22.  The thrombosis looked resolved at that time.  He was given hydrocortisone cream and referral to GI was placed.  Since then he has noticed a little bit of drainage and discharge she says will start to feel bad almost a little sweaty and then he will notice a little bit of yellow drainage and actually feel little better.  He does not think he has had any fevers.   He is a Administrator for living.  He has been using a donut cushion for about 2 months.  Past Medical History:  Diagnosis Date   Generalized anxiety disorder 12/23/2013   Hypertension     Past Surgical History:  Procedure Laterality Date   NO PAST SURGERIES      Family History  Problem Relation Age of Onset   Alcoholism Father    Cancer Other        grandmother   Heart attack Other    Depression Other        grandfather   Diabetes Mother    Hyperlipidemia Mother    Hypertension Other     Social History   Socioeconomic History   Marital status: Married    Spouse name: Not on file   Number of children: Not on file   Years of education: Not on file   Highest education level: Not on file  Occupational History   Not on file  Tobacco Use   Smoking status: Heavy Smoker    Packs/day: 0.50    Years: 35.00    Pack years: 17.50    Types: Cigarettes   Smokeless tobacco: Current    Types: Snuff   Tobacco comments:    smokes on the weekends,dips snuff during the week  Substance and Sexual Activity   Alcohol use: Yes     Comment: rarely   Drug use: No   Sexual activity: Not Currently    Partners: Female  Other Topics Concern   Not on file  Social History Narrative   Not on file   Social Determinants of Health   Financial Resource Strain: Not on file  Food Insecurity: Not on file  Transportation Needs: Not on file  Physical Activity: Not on file  Stress: Not on file  Social Connections: Not on file  Intimate Partner Violence: Not on file    Outpatient Medications Prior to Visit  Medication Sig Dispense Refill   albuterol (VENTOLIN HFA) 108 (90 Base) MCG/ACT inhaler INHALE 2 PUFFS BY MOUTH EVERY 4 HOURS AS NEEDED FOR WHEEZING FOR SHORTNESS OF BREATH 9 g 0   AMBULATORY NON FORMULARY MEDICATION Portable CPAP machine and supplies: 9 cm H2O with heat and humidity using a ResMed AirFit P10 mask. Dx: OSA 1 Units 0   atorvastatin (LIPITOR) 40 MG tablet Take 1 tablet (40 mg total) by mouth daily. 90 tablet 3   hydrocortisone (ANUSOL-HC) 25 MG suppository Place 1 suppository (25  mg total) rectally 2 (two) times daily as needed for hemorrhoids. 24 suppository 1   ketoconazole (NIZORAL) 2 % cream APPLY TOPICALLY TO THE AFFECTED AREA TWICE DAILY 120 g 3   lisinopril (ZESTRIL) 10 MG tablet Take 1 tablet (10 mg total) by mouth daily. 90 tablet 1   tamsulosin (FLOMAX) 0.4 MG CAPS capsule Take 1 capsule (0.4 mg total) by mouth daily after supper. 90 capsule 3   No facility-administered medications prior to visit.    No Known Allergies  Review of Systems     Objective:    Physical Exam Constitutional:      Appearance: Normal appearance.  Genitourinary:    Comments: At the 3 o'clock position on the left rectal area going into the anus he has a very firm hard hemorrhoid.  It is extremely tender to touch there are some mild erythema on the skin around the area but not well demarcated at the borders.  I did see a little bit of yellow drainage on the right side of the rectum but there was no palpable lesion or  open wound on that side so suspect that the drainage either came from the anal area or from the left side.  To express any drainage or pus. Neurological:     Mental Status: He is alert.    BP 136/65   Pulse 65   Ht '5\' 11"'  (1.803 m)   Wt (!) 317 lb (143.8 kg)   SpO2 100%   BMI 44.21 kg/m  Wt Readings from Last 3 Encounters:  06/28/21 (!) 317 lb (143.8 kg)  06/10/21 (!) 320 lb (145.2 kg)  05/24/21 (!) 319 lb (144.7 kg)    There are no preventive care reminders to display for this patient.  There are no preventive care reminders to display for this patient.   Lab Results  Component Value Date   TSH 0.93 05/03/2021   Lab Results  Component Value Date   WBC 6.5 05/03/2021   HGB 15.0 05/03/2021   HCT 44.3 05/03/2021   MCV 90.0 05/03/2021   PLT 186 05/03/2021   Lab Results  Component Value Date   NA 139 05/03/2021   K 4.2 05/03/2021   CO2 24 05/03/2021   GLUCOSE 103 (H) 05/03/2021   BUN 14 05/03/2021   CREATININE 1.00 05/03/2021   BILITOT 1.2 05/03/2021   AST 19 05/03/2021   ALT 23 05/03/2021   PROT 6.6 05/03/2021   CALCIUM 9.2 05/03/2021   EGFR 91 05/03/2021   Lab Results  Component Value Date   CHOL 120 05/03/2021   Lab Results  Component Value Date   HDL 38 (L) 05/03/2021   Lab Results  Component Value Date   LDLCALC 62 05/03/2021   Lab Results  Component Value Date   TRIG 117 05/03/2021   Lab Results  Component Value Date   CHOLHDL 3.2 05/03/2021   Lab Results  Component Value Date   HGBA1C 5.1 05/03/2021       Assessment & Plan:   Problem List Items Addressed This Visit       Cardiovascular and Mediastinum   External thrombosed hemorrhoids   Other Visit Diagnoses     Rectal pain    -  Primary   Relevant Orders   CT Abdomen Pelvis Wo Contrast      Thrombosed hemorrhoid-he did have a thrombosed hemorrhoid again.  Recommend excision for pain relief.  Patient tolerated procedure well.  Small clot removed from the area I did not see  any additional clots upon exploration of the area.  I am also concerned about potential for perirectal abscess since is been going on for quite some time and has been seeing a little bit of yellow drainage.  No fevers or chills.  Would recommend CT of the pelvis for further work-up.  We did call his insurance company and he does not have any imaging coverage so he would be self-pay.  Did notify imaging and they will contact him.  He would need to drink oral contrast.  Commend continue with sits baths with the Epson salt soaks.  Clean the area well after bowel movement.  Meds ordered this encounter  Medications   sulfamethoxazole-trimethoprim (BACTRIM DS) 800-160 MG tablet    Sig: Take 1 tablet by mouth 2 (two) times daily.    Dispense:  20 tablet    Refill:  0   Incision Thrombosed Hemorrhoid Procedure Note  Pre-operative Diagnosis: Thrombosed external hemorrhoid  Post-operative Diagnosis: Thrombosed external hemorrhoid  Locations:left side of rectum.   Indications: Painful external hemorrhoid. Explained surgical vs conservative treatment; surgical incision and drainage of clot usually works quickly to reduce pain and shorten healing time, but is uncomfortable to perform.  After discussion, the patient wishes to proceed with this procedure.  Anesthesia: Lidocaine 1% without epinephrine without added sodium bicarbonate  Procedure Details  After adequate anesthesia, an elliptical incision was made, and the visible clot was extruded with curved hemostat. This was well tolerated. Bulky dressing is applied.  Complications: none.  Plan: 1. Very frequent Sitz baths. 2. Colace tid prn and increase fluids to prevent constipation.   3. Call or return to clinic prn if these symptoms worsen or fail to improve as anticipated   Beatrice Lecher, MD

## 2021-06-28 NOTE — Telephone Encounter (Signed)
Patient called and scheduled.

## 2021-06-29 ENCOUNTER — Ambulatory Visit (INDEPENDENT_AMBULATORY_CARE_PROVIDER_SITE_OTHER): Payer: PRIVATE HEALTH INSURANCE

## 2021-06-29 DIAGNOSIS — K625 Hemorrhage of anus and rectum: Secondary | ICD-10-CM

## 2021-06-29 DIAGNOSIS — K6289 Other specified diseases of anus and rectum: Secondary | ICD-10-CM

## 2021-07-01 NOTE — Progress Notes (Signed)
HI Santhosh, CT did not show any type of abscess which is fantastic news.  Hopefully you have been able to continue with the Epson salt soaks and hopefully you are getting a little relief.  Continue to work on softening the stools okay to increase the stool softener to 3 times a day if needed to do so.  Liver and gallbladder, pancreas and spleen also look normal.  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  Normal prostate as well.

## 2021-08-08 ENCOUNTER — Encounter: Payer: Self-pay | Admitting: Physician Assistant

## 2021-08-09 ENCOUNTER — Other Ambulatory Visit: Payer: Self-pay

## 2021-08-09 ENCOUNTER — Encounter: Payer: Self-pay | Admitting: Medical-Surgical

## 2021-08-09 ENCOUNTER — Ambulatory Visit (INDEPENDENT_AMBULATORY_CARE_PROVIDER_SITE_OTHER): Payer: Self-pay | Admitting: Medical-Surgical

## 2021-08-09 VITALS — BP 129/73 | HR 91 | Temp 97.6°F | Wt 314.1 lb

## 2021-08-09 DIAGNOSIS — K625 Hemorrhage of anus and rectum: Secondary | ICD-10-CM

## 2021-08-09 NOTE — Progress Notes (Signed)
  HPI with pertinent ROS:   CC: rectal bleeding  HPI: Pleasant 50 year old male presenting today for evaluation of rectal bleeding. Has had a few visits recently for issues with a thrombosed hemorrhoid and a subsequent infection. Completed his antibiotics as prescribed but continued to have thick greenish drainage from the site. Had notable swelling and rectal discomfort. Has been studiously cleaning the area twice daily with antibacterial soap. Noted that the drainage was blood tinged a few days ago and when he was cleaning the area, he had a flat spot of what looked like dried blood present. When he pulled that off, a small "pebble" came out, slimy and sticky but dried on the inside when it broke apart. Since this came out, his swelling has resolved and he has had no more blood drainage. For the first time in weeks, he does not have rectal pain and is able to sit without a donut pillow. No fevers or chills.   I reviewed the past medical history, family history, social history, surgical history, and allergies today and no changes were needed.  Please see the problem list section below in epic for further details.   Physical exam:   General: Well Developed, well nourished, and in no acute distress.  Neuro: Alert and oriented x3.  HEENT: Normocephalic, atraumatic.  Skin: Warm and dry. Cardiac: Regular rate and rhythm, no murmurs rubs or gallops, no lower extremity edema.  Respiratory: Clear to auscultation bilaterally. Not using accessory muscles, speaking in full sentences. Rectal: External rectal exam showing no erythema, edema, bleeding, or drainage. No further thrombosed hemorroids noted.   Impression and Recommendations:    1. Rectal bleeding Resolved. Site appears to be healing well with no current signs of infection. Recommend monitoring for now. Keep the area clean and dry. Clean with mild soap and water but be careful with overuse of antibacterial soaps. If further symptoms develop or  pain returns, advised patient to come back in for evaluation.   Return if symptoms worsen or fail to improve. ___________________________________________ Thayer Ohm, DNP, APRN, FNP-BC Primary Care and Sports Medicine [facility] [place]

## 2021-08-28 ENCOUNTER — Other Ambulatory Visit: Payer: Self-pay | Admitting: Physician Assistant

## 2021-08-28 DIAGNOSIS — J3089 Other allergic rhinitis: Secondary | ICD-10-CM

## 2021-11-08 ENCOUNTER — Ambulatory Visit (INDEPENDENT_AMBULATORY_CARE_PROVIDER_SITE_OTHER): Payer: 59 | Admitting: Physician Assistant

## 2021-11-08 ENCOUNTER — Encounter: Payer: Self-pay | Admitting: Physician Assistant

## 2021-11-08 ENCOUNTER — Other Ambulatory Visit: Payer: Self-pay

## 2021-11-08 VITALS — BP 124/70 | HR 73 | Ht 71.0 in | Wt 319.0 lb

## 2021-11-08 DIAGNOSIS — K645 Perianal venous thrombosis: Secondary | ICD-10-CM | POA: Diagnosis not present

## 2021-11-08 DIAGNOSIS — Z1211 Encounter for screening for malignant neoplasm of colon: Secondary | ICD-10-CM

## 2021-11-08 DIAGNOSIS — I10 Essential (primary) hypertension: Secondary | ICD-10-CM | POA: Diagnosis not present

## 2021-11-08 DIAGNOSIS — K625 Hemorrhage of anus and rectum: Secondary | ICD-10-CM | POA: Diagnosis not present

## 2021-11-08 NOTE — Progress Notes (Signed)
° °  Subjective:    Patient ID: Tanner Rodriguez, male    DOB: Dec 26, 1970, 51 y.o.   MRN: [id number]  HPI Pt is a 51 yo obese male who presents to the clinic to discuss hemorrhoids.   He has been to the clinic 3 times in the last 6 months for bright red blood and thrombosed hemorrhoids. He still has flares from time to time. His stools can be firm to hard. He is on colace daily. He denies any pain today. He is tired of the flares and wants GI referral.   .. Active Ambulatory Problems    Diagnosis Date Noted   Generalized anxiety disorder 12/23/2013   Seborrheic dermatitis 05/12/2014   OSA on CPAP 01/24/2015   Essential hypertension 05/23/2019   Morbid obesity (HCC) 05/23/2019   Rosacea 05/23/2019   Cervical spondylosis 07/13/2019   Cervical radiculopathy 07/13/2019   Hyperlipidemia 10/29/2020   Benign prostatic hyperplasia with weak urinary stream 05/03/2021   External thrombosed hemorrhoids 05/24/2021   Hemorrhoids 06/10/2021   Bright red blood per rectum 06/10/2021   Resolved Ambulatory Problems    Diagnosis Date Noted   Chest tightness 05/23/2019   Chest pain 05/05/2021   Past Medical History:  Diagnosis Date   Hypertension      Review of Systems   See HPI.  Objective:   Physical Exam Vitals reviewed.  Constitutional:      Appearance: Normal appearance. He is obese.  HENT:     Head: Normocephalic.  Neck:     Vascular: No carotid bruit.  Cardiovascular:     Rate and Rhythm: Normal rate and regular rhythm.     Pulses: Normal pulses.     Heart sounds: Normal heart sounds.  Pulmonary:     Effort: Pulmonary effort is normal.     Breath sounds: Normal breath sounds.  Neurological:     General: No focal deficit present.     Mental Status: He is alert and oriented to person, place, and time.  Psychiatric:        Mood and Affect: Mood normal.          Assessment & Plan:  Marland KitchenMarland KitchenKorion was seen today for follow-up.  Diagnoses and all orders for this  visit:  External thrombosed hemorrhoids -     Ambulatory referral to Gastroenterology  Essential hypertension  Bright red blood per rectum -     Ambulatory referral to Gastroenterology  Colon cancer screening -     Ambulatory referral to Gastroenterology   Continue stool softeners Use hemorrhoid creams as needed Referral to GI needs colonoscopy as well BP looks great

## 2022-01-21 ENCOUNTER — Other Ambulatory Visit: Payer: Self-pay

## 2022-01-21 DIAGNOSIS — J3089 Other allergic rhinitis: Secondary | ICD-10-CM

## 2022-01-21 MED ORDER — ALBUTEROL SULFATE HFA 108 (90 BASE) MCG/ACT IN AERS: INHALATION_SPRAY | RESPIRATORY_TRACT | 1 refills | Status: DC

## 2022-02-03 ENCOUNTER — Other Ambulatory Visit: Payer: Self-pay | Admitting: Physician Assistant

## 2022-02-03 DIAGNOSIS — I10 Essential (primary) hypertension: Secondary | ICD-10-CM

## 2022-02-03 DIAGNOSIS — E785 Hyperlipidemia, unspecified: Secondary | ICD-10-CM

## 2022-04-11 ENCOUNTER — Other Ambulatory Visit: Payer: Self-pay | Admitting: Physician Assistant

## 2022-04-11 DIAGNOSIS — J3089 Other allergic rhinitis: Secondary | ICD-10-CM

## 2022-05-09 ENCOUNTER — Ambulatory Visit (INDEPENDENT_AMBULATORY_CARE_PROVIDER_SITE_OTHER): Payer: 59 | Admitting: Physician Assistant

## 2022-05-09 ENCOUNTER — Encounter: Payer: Self-pay | Admitting: Physician Assistant

## 2022-05-09 VITALS — BP 103/55 | HR 64 | Ht 71.0 in | Wt 319.0 lb

## 2022-05-09 DIAGNOSIS — Z0000 Encounter for general adult medical examination without abnormal findings: Secondary | ICD-10-CM | POA: Diagnosis not present

## 2022-05-09 DIAGNOSIS — I10 Essential (primary) hypertension: Secondary | ICD-10-CM | POA: Diagnosis not present

## 2022-05-09 DIAGNOSIS — R7301 Impaired fasting glucose: Secondary | ICD-10-CM

## 2022-05-09 DIAGNOSIS — N401 Enlarged prostate with lower urinary tract symptoms: Secondary | ICD-10-CM

## 2022-05-09 DIAGNOSIS — L608 Other nail disorders: Secondary | ICD-10-CM | POA: Insufficient documentation

## 2022-05-09 DIAGNOSIS — B351 Tinea unguium: Secondary | ICD-10-CM | POA: Diagnosis not present

## 2022-05-09 DIAGNOSIS — Z125 Encounter for screening for malignant neoplasm of prostate: Secondary | ICD-10-CM | POA: Diagnosis not present

## 2022-05-09 DIAGNOSIS — R3912 Poor urinary stream: Secondary | ICD-10-CM | POA: Diagnosis not present

## 2022-05-09 DIAGNOSIS — R69 Illness, unspecified: Secondary | ICD-10-CM | POA: Diagnosis not present

## 2022-05-09 DIAGNOSIS — Z1329 Encounter for screening for other suspected endocrine disorder: Secondary | ICD-10-CM | POA: Diagnosis not present

## 2022-05-09 DIAGNOSIS — E785 Hyperlipidemia, unspecified: Secondary | ICD-10-CM | POA: Diagnosis not present

## 2022-05-09 DIAGNOSIS — F17200 Nicotine dependence, unspecified, uncomplicated: Secondary | ICD-10-CM

## 2022-05-09 HISTORY — DX: Other nail disorders: L60.8

## 2022-05-09 MED ORDER — LISINOPRIL 10 MG PO TABS: 10.0000 mg | ORAL_TABLET | Freq: Every day | ORAL | 1 refills | Status: DC

## 2022-05-09 NOTE — Patient Instructions (Signed)
Will make referral to podiatry

## 2022-05-09 NOTE — Progress Notes (Signed)
Established Patient Office Visit  Subjective   Patient ID: Tanner Rodriguez, male    DOB: 06-Sep-1971  Age: 51 y.o. MRN: [id number]  Chief Complaint  Patient presents with   Follow-up   Hypertension    HPI Pt is a 51 y.o. obese male with HTN, OSA, HLD who presents to the clinic for medication refills and follow up.   Pt is doing well. He denies any CP, palpitations, headaches or vision changes. Taking medication daily. Using CPAP daily. Continues to have urinary frequency and urgency but did not think flomax helped so stopped taking.   He does having ongoing toenail fungus on great left toe that he would like referral for. Has removed in past but came back.    Patient Active Problem List   Diagnosis Date Noted   Onychomadesis of toenail 05/09/2022   Hemorrhoids 06/10/2021   Bright red blood per rectum 06/10/2021   External thrombosed hemorrhoids 05/24/2021   Benign prostatic hyperplasia with weak urinary stream 05/03/2021   Hyperlipidemia 10/29/2020   Cervical spondylosis 07/13/2019   Cervical radiculopathy 07/13/2019   Essential hypertension 05/23/2019   Morbid obesity (HCC) 05/23/2019   Rosacea 05/23/2019   OSA on CPAP 01/24/2015   Seborrheic dermatitis 05/12/2014   Generalized anxiety disorder 12/23/2013   Past Medical History:  Diagnosis Date   Generalized anxiety disorder 12/23/2013   Hypertension    Family History  Problem Relation Age of Onset   Alcoholism Father    Cancer Other        grandmother   Heart attack Other    Depression Other        grandfather   Diabetes Mother    Hyperlipidemia Mother    Hypertension Other    No Known Allergies    Review of Systems  All other systems reviewed and are negative.     Objective:     BP (!) 103/55   Pulse 64   Ht 5\' 11"  (1.803 m)   Wt (!) 319 lb (144.7 kg)   SpO2 99%   BMI 44.49 kg/m  BP Readings from Last 3 Encounters:  05/09/22 (!) 103/55  11/08/21 124/70  08/09/21 129/73   Wt Readings from Last  3 Encounters:  05/09/22 (!) 319 lb (144.7 kg)  11/08/21 (!) 319 lb (144.7 kg)  08/09/21 (!) 314 lb 1.9 oz (142.5 kg)      Physical Exam Constitutional:      Appearance: Normal appearance. He is obese.  HENT:     Head: Normocephalic.  Cardiovascular:     Rate and Rhythm: Normal rate and regular rhythm.     Pulses: Normal pulses.     Heart sounds: Normal heart sounds.  Pulmonary:     Effort: Pulmonary effort is normal.     Breath sounds: Normal breath sounds.  Neurological:     General: No focal deficit present.     Mental Status: He is alert and oriented to person, place, and time.  Psychiatric:        Mood and Affect: Mood normal.         Assessment & Plan:  10/23/22Marland KitchenDaric was seen today for follow-up and hypertension.  Diagnoses and all orders for this visit:  Essential hypertension -     COMPLETE METABOLIC PANEL WITH GFR -     lisinopril (ZESTRIL) 10 MG tablet; Take 1 tablet (10 mg total) by mouth daily.  Hyperlipidemia, unspecified hyperlipidemia type -     Lipid Panel w/reflex Direct LDL -  lisinopril (ZESTRIL) 10 MG tablet; Take 1 tablet (10 mg total) by mouth daily.  Screening PSA (prostate specific antigen) -     PSA  Thyroid disorder screen -     TSH  Preventative health care -     PSA -     TSH -     Lipid Panel w/reflex Direct LDL -     COMPLETE METABOLIC PANEL WITH GFR -     CBC with Differential/Platelet -     Hemoglobin A1c  Elevated fasting glucose -     Hemoglobin A1c  Benign prostatic hyperplasia with weak urinary stream  Onychomycosis -     Ambulatory referral to Podiatry  Current smoker   BP looks great Refilled lisinopril Fasting labs ordered today AUA elevated not taking flomax Discussed medications PsA ordered Encouraged smoking cessation-pt will try to cut back  Referral for great left toenail fungus.  Return in about 6 months (around 11/09/2022).    Tandy Gaw, PA-C

## 2022-05-10 LAB — HEMOGLOBIN A1C
Hgb A1c MFr Bld: 5.1 % of total Hgb (ref <5.7)
Mean Plasma Glucose: 100 mg/dL
eAG (mmol/L): 5.5 mmol/L

## 2022-05-10 LAB — CBC WITH DIFFERENTIAL/PLATELET
Absolute Monocytes: 555 cells/uL (ref 200–950)
Basophils Absolute: 53 cells/uL (ref 0–200)
Basophils Relative: 0.7 %
Eosinophils Absolute: 167 cells/uL (ref 15–500)
Eosinophils Relative: 2.2 %
HCT: 48.4 % (ref 38.5–50.0)
Hemoglobin: 16 g/dL (ref 13.2–17.1)
Lymphs Abs: 2348 cells/uL (ref 850–3900)
MCH: 30.8 pg (ref 27.0–33.0)
MCHC: 33.1 g/dL (ref 32.0–36.0)
MCV: 93.1 fL (ref 80.0–100.0)
MPV: 10.7 fL (ref 7.5–12.5)
Monocytes Relative: 7.3 %
Neutro Abs: 4476 cells/uL (ref 1500–7800)
Neutrophils Relative %: 58.9 %
Platelets: 221 10*3/uL (ref 140–400)
RBC: 5.2 10*6/uL (ref 4.20–5.80)
RDW: 12.5 % (ref 11.0–15.0)
Total Lymphocyte: 30.9 %
WBC: 7.6 10*3/uL (ref 3.8–10.8)

## 2022-05-10 LAB — LIPID PANEL W/REFLEX DIRECT LDL
Cholesterol: 133 mg/dL (ref <200)
HDL: 39 mg/dL — ABNORMAL LOW (ref >=40)
LDL Cholesterol (Calc): 77 mg/dL (calc)
Non-HDL Cholesterol (Calc): 94 mg/dL (calc) (ref <130)
Total CHOL/HDL Ratio: 3.4 (calc) (ref <5.0)
Triglycerides: 90 mg/dL (ref <150)

## 2022-05-10 LAB — TSH: TSH: 1.51 mIU/L (ref 0.40–4.50)

## 2022-05-10 LAB — COMPLETE METABOLIC PANEL WITH GFR
AG Ratio: 1.9 (calc) (ref 1.0–2.5)
ALT: 21 U/L (ref 9–46)
AST: 18 U/L (ref 10–35)
Albumin: 4.5 g/dL (ref 3.6–5.1)
Alkaline phosphatase (APISO): 62 U/L (ref 35–144)
BUN: 22 mg/dL (ref 7–25)
CO2: 23 mmol/L (ref 20–32)
Calcium: 9.7 mg/dL (ref 8.6–10.3)
Chloride: 106 mmol/L (ref 98–110)
Creat: 1.21 mg/dL (ref 0.70–1.30)
Globulin: 2.4 g/dL (calc) (ref 1.9–3.7)
Glucose, Bld: 98 mg/dL (ref 65–99)
Potassium: 4.5 mmol/L (ref 3.5–5.3)
Sodium: 140 mmol/L (ref 135–146)
Total Bilirubin: 0.9 mg/dL (ref 0.2–1.2)
Total Protein: 6.9 g/dL (ref 6.1–8.1)
eGFR: 72 mL/min/{1.73_m2} (ref >=60)

## 2022-05-10 LAB — PSA: PSA: 0.37 ng/mL (ref <=4.00)

## 2022-05-10 NOTE — Progress Notes (Signed)
Cholesterol looks great.  PSA stable and normal.  Thyroid looks great.  A1C looks good.

## 2022-05-24 ENCOUNTER — Ambulatory Visit: Payer: 59 | Admitting: Podiatry

## 2022-05-24 DIAGNOSIS — L600 Ingrowing nail: Secondary | ICD-10-CM | POA: Diagnosis not present

## 2022-05-24 NOTE — Progress Notes (Signed)
Subjective:  Patient ID: Tanner Rodriguez, male    DOB: 09/04/1971,  MRN: [id number]  Chief Complaint  Patient presents with   Nail Problem    Left hallux nail removal     51 y.o. male presents with the above complaint.  Patient presents with left hallux dystrophic nail.  Patient had it taken out multiple times.  At this time he states this is painful to touch is progressive and worse he goes back the same way.  He would like to have it removed and made permanent he has not seen anyone as prior to seeing me hurts with ambulation and hurts with pressure.  Pain scale 7 out of 10   Review of Systems: Negative except as noted in the HPI. Denies N/V/F/Ch.  Past Medical History:  Diagnosis Date   Generalized anxiety disorder 12/23/2013   Hypertension     Current Outpatient Medications:    albuterol (VENTOLIN HFA) 108 (90 Base) MCG/ACT inhaler, INHALE 2 PUFFS BY MOUTH EVERY 4 HOURS AS NEEDED FOR WHEEZING FOR SHORTNESS OF BREATH, Disp: 9 g, Rfl: 0   AMBULATORY NON FORMULARY MEDICATION, Portable CPAP machine and supplies: 9 cm H2O with heat and humidity using a ResMed AirFit P10 mask. Dx: OSA, Disp: 1 Units, Rfl: 0   atorvastatin (LIPITOR) 40 MG tablet, Take 1 tablet (40 mg total) by mouth daily., Disp: 90 tablet, Rfl: 3   hydrocortisone (ANUSOL-HC) 25 MG suppository, Place 1 suppository (25 mg total) rectally 2 (two) times daily as needed for hemorrhoids., Disp: 24 suppository, Rfl: 1   ketoconazole (NIZORAL) 2 % cream, APPLY TOPICALLY TO THE AFFECTED AREA TWICE DAILY, Disp: 120 g, Rfl: 3   lisinopril (ZESTRIL) 10 MG tablet, Take 1 tablet (10 mg total) by mouth daily., Disp: 90 tablet, Rfl: 1  Social History   Tobacco Use  Smoking Status Heavy Smoker   Packs/day: 0.50   Years: 35.00   Total pack years: 17.50   Types: Cigarettes  Smokeless Tobacco Current   Types: Snuff  Tobacco Comments   smokes on the weekends,dips snuff during the week    No Known Allergies Objective:  There were  no vitals filed for this visit. There is no height or weight on file to calculate BMI. Constitutional Well developed. Well nourished.  Vascular Dorsalis pedis pulses palpable bilaterally. Posterior tibial pulses palpable bilaterally. Capillary refill normal to all digits.  No cyanosis or clubbing noted. Pedal hair growth normal.  Neurologic Normal speech. Oriented to person, place, and time. Epicritic sensation to light touch grossly present bilaterally.  Dermatologic Pain on palpation of the entire/total nail on 1st digit of the left No other open wounds. No skin lesions.  Orthopedic: Normal joint ROM without pain or crepitus bilaterally. No visible deformities. No bony tenderness.   Radiographs: None Assessment:   1. Ingrown left big toenail    Plan:  Patient was evaluated and treated and all questions answered.  Nail contusion/dystrophy hallux with underlying ingrown, left -Patient elects to proceed with minor surgery to remove entire toenail today. Consent reviewed and signed by patient. -Entire/total nail excised. See procedure note. -Educated on post-procedure care including soaking. Written instructions provided and reviewed. -Patient to follow up in 2 weeks for nail check.  Procedure: Excision of entire/total nail with phenol matricectomy Location: Left 1st toe digit Anesthesia: Lidocaine 1% plain; 1.5 mL and Marcaine 0.5% plain; 1.5 mL, digital block. Skin Prep: Betadine. Dressing: Silvadene; telfa; dry, sterile, compression dressing. Technique: Following skin prep, the toe was exsanguinated and  a tourniquet was secured at the base of the toe. The affected nail border was freed and excised.  Matrixectomy was performed in standard technique no complication noted no bleeding noted the tourniquet was then removed and sterile dressing applied. Disposition: Patient tolerated procedure well. Patient to return in 2 weeks for follow-up.   No follow-ups on file.

## 2022-05-29 ENCOUNTER — Other Ambulatory Visit: Payer: Self-pay | Admitting: Physician Assistant

## 2022-05-29 DIAGNOSIS — J3089 Other allergic rhinitis: Secondary | ICD-10-CM

## 2022-06-01 ENCOUNTER — Encounter: Payer: Self-pay | Admitting: Physician Assistant

## 2022-06-01 DIAGNOSIS — L219 Seborrheic dermatitis, unspecified: Secondary | ICD-10-CM

## 2022-06-02 MED ORDER — KETOCONAZOLE 2 % EX CREA: TOPICAL_CREAM | CUTANEOUS | 3 refills | Status: DC

## 2022-06-18 IMAGING — CT CT ABD-PELV W/O CM
2 of 4 series · 17 of 46 positions shown, 19 images · non-contrast
Comparison: None.

CLINICAL DATA: Perineal pain. Rectal bleeding. Evaluate for
perirectal abscess.

EXAM:
CT ABDOMEN AND PELVIS WITHOUT CONTRAST
TECHNIQUE: Multidetector CT imaging of the abdomen and pelvis was performed
following the standard protocol without IV contrast.

[Series 2: axial st · axial · 0.92mm/px · z∈[+614,+1134]mm · 14 of 114 slices shown, 16 images]
[im 5/114  soft-tissue]
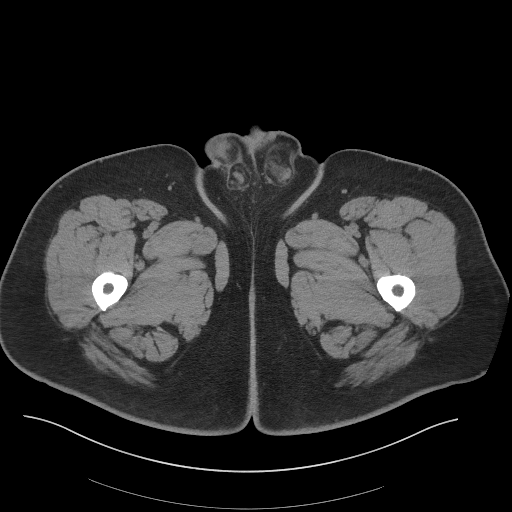
[im 5/114  bone]
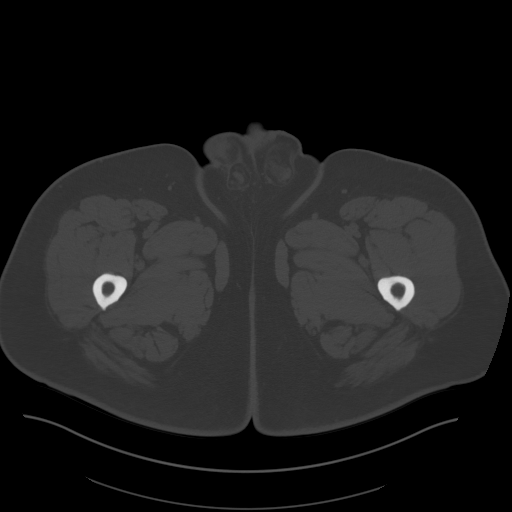
[im 14/114  soft-tissue]
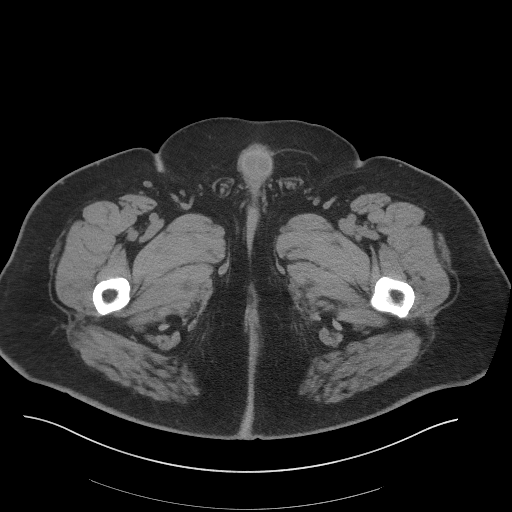
[im 23/114  soft-tissue]
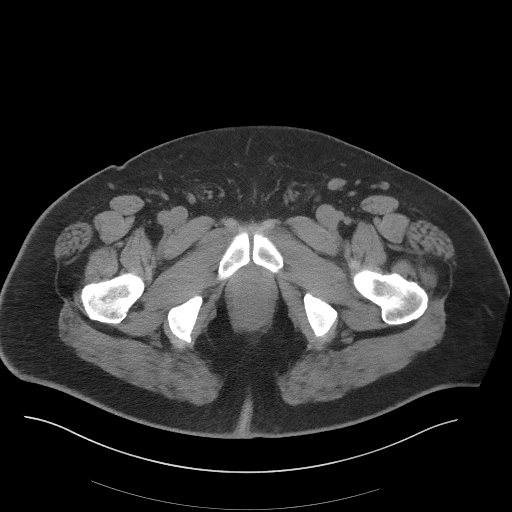
[im 32/114  soft-tissue]
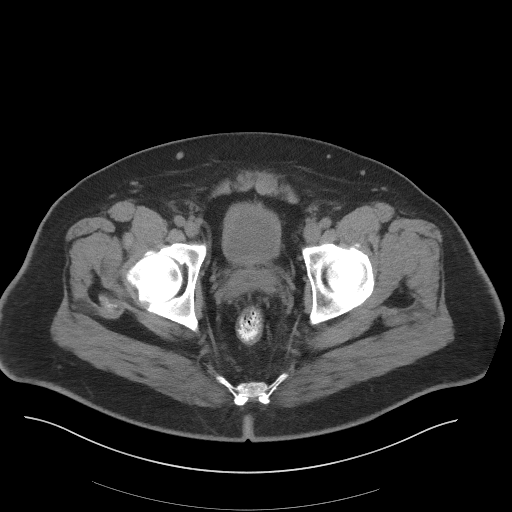
[im 37/114  soft-tissue]
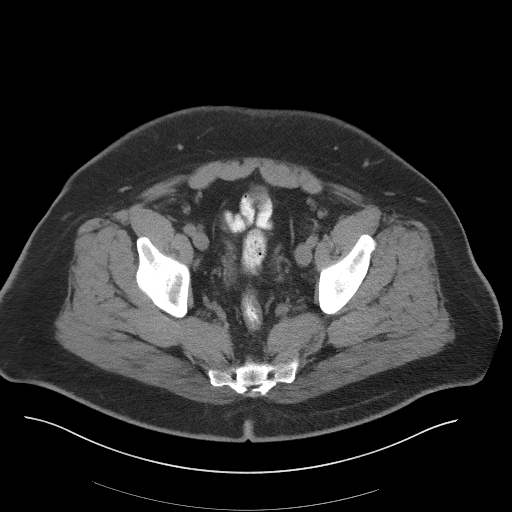
[im 46/114  soft-tissue]
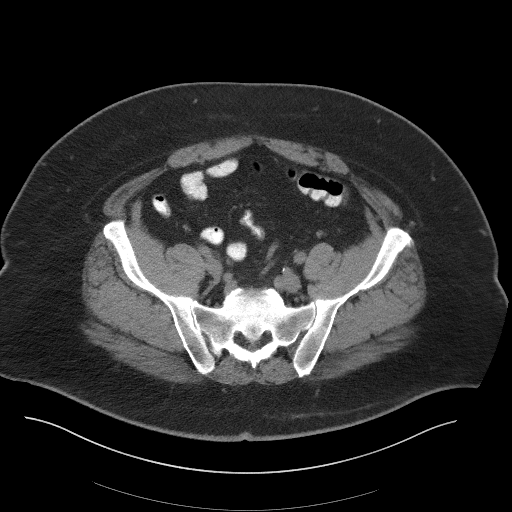
[im 55/114  soft-tissue]
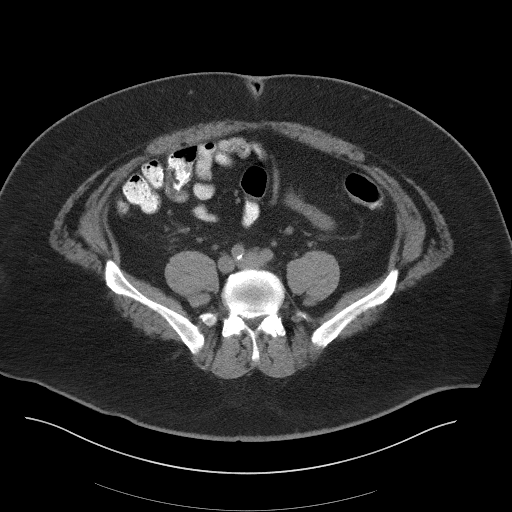
[im 59/114  soft-tissue]
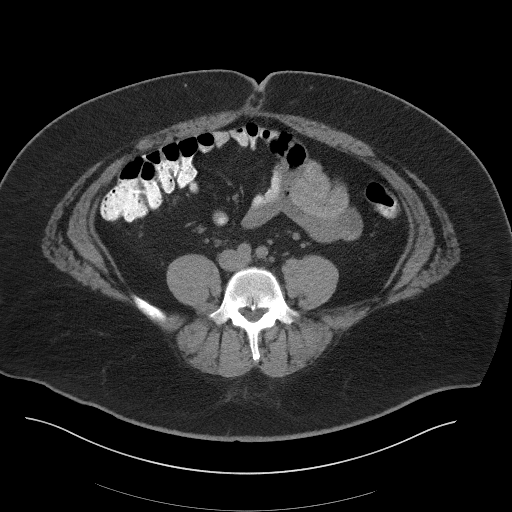
[im 68/114  soft-tissue]
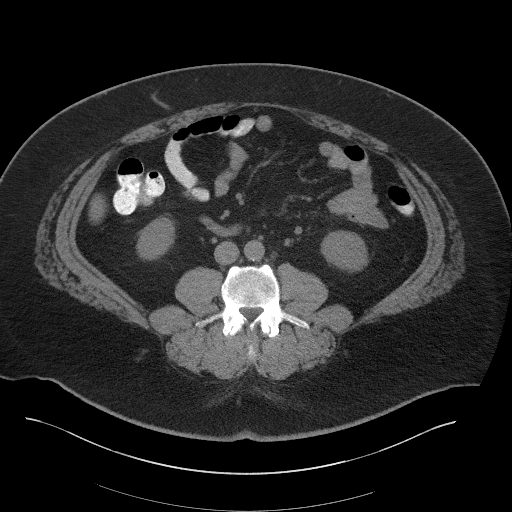
[im 68/114  bone]
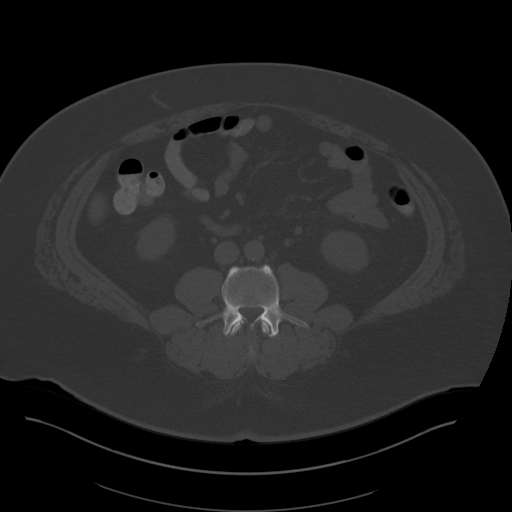
[im 77/114  soft-tissue]
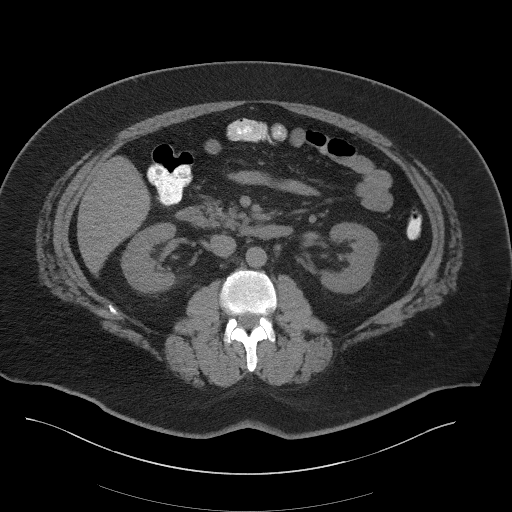
[im 86/114  soft-tissue]
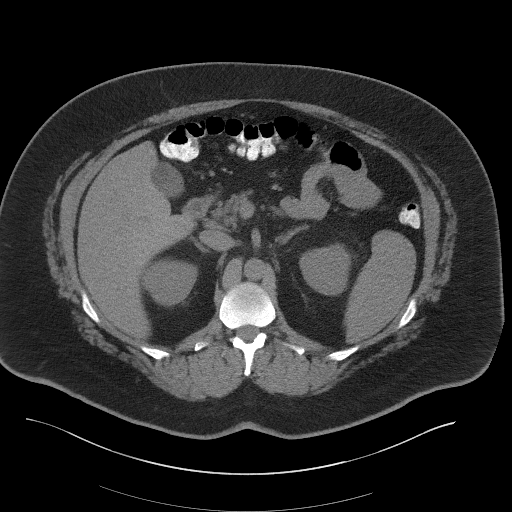
[im 91/114  soft-tissue]
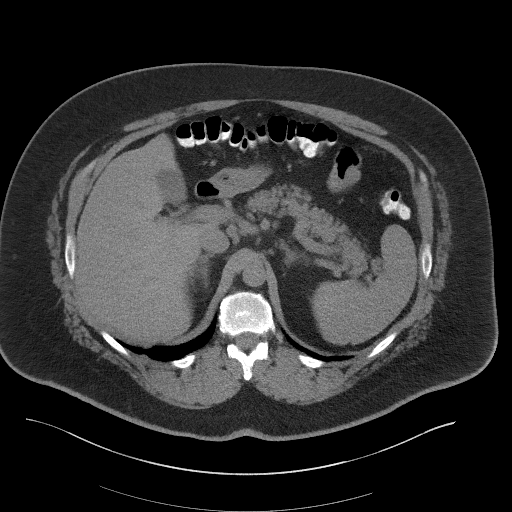
[im 100/114  soft-tissue]
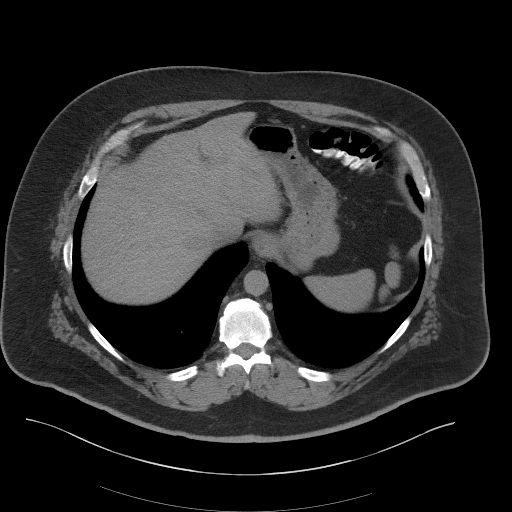
[im 109/114  soft-tissue]
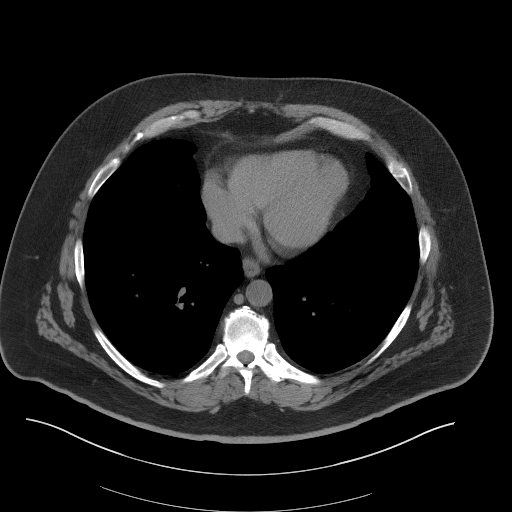

[Series 5: coronal st · coronal · 1.04mm/px · 3 of 107 slices shown]
[im 36/107  soft-tissue]
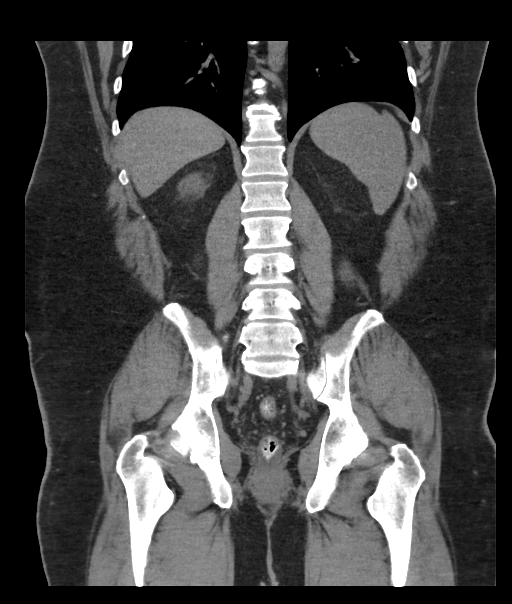
[im 48/107  soft-tissue]
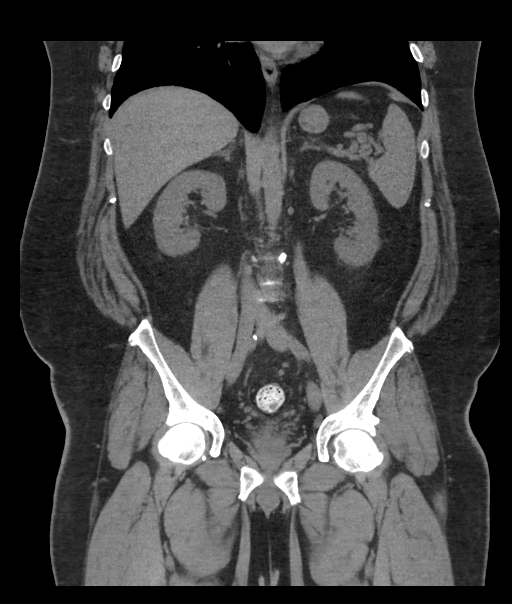
[im 59/107  soft-tissue]
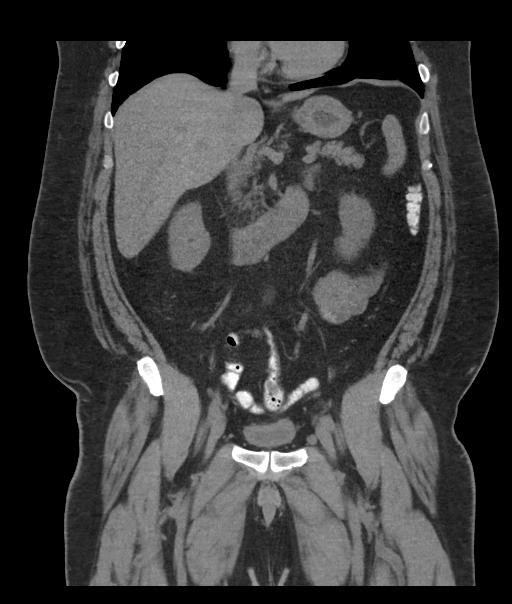

[17 of 46 positions shown; findings below may reference images not displayed]

FINDINGS: Lower chest: Clear lung bases. Normal heart size without pericardial
or pleural effusion.

Hepatobiliary: Normal liver. Normal gallbladder, without biliary
ductal dilatation.

Pancreas: Normal, without mass or ductal dilatation.

Spleen: Normal in size, without focal abnormality.

Adrenals/Urinary Tract: Normal adrenal glands. No renal calculi or
hydronephrosis. No hydroureter or ureteric calculi. No bladder
calculi.

Stomach/Bowel: Normal stomach, without wall thickening. No perianal
or perirectal fluid collection. No proctitis or colitis. Normal
terminal ileum and appendix. Normal small bowel.

Vascular/Lymphatic: Aortic atherosclerosis. No abdominopelvic
adenopathy.

Reproductive: Normal prostate.

Other: No significant free fluid.  No free intraperitoneal air.

Musculoskeletal: Degenerative disc disease at L4-5 and L5-S1.
IMPRESSION: 1. No acute process or explanation for perineal pain/rectal
bleeding. No evidence of perianal or perirectal abscess.
2.  Aortic Atherosclerosis (R2NSR-LPL.L).

## 2022-07-08 ENCOUNTER — Other Ambulatory Visit: Payer: Self-pay | Admitting: Physician Assistant

## 2022-07-08 DIAGNOSIS — E785 Hyperlipidemia, unspecified: Secondary | ICD-10-CM

## 2022-07-08 DIAGNOSIS — I10 Essential (primary) hypertension: Secondary | ICD-10-CM

## 2022-09-09 ENCOUNTER — Other Ambulatory Visit: Payer: Self-pay | Admitting: Physician Assistant

## 2022-09-09 DIAGNOSIS — J3089 Other allergic rhinitis: Secondary | ICD-10-CM

## 2022-10-03 ENCOUNTER — Other Ambulatory Visit: Payer: Self-pay | Admitting: Physician Assistant

## 2022-10-03 DIAGNOSIS — L219 Seborrheic dermatitis, unspecified: Secondary | ICD-10-CM

## 2022-10-14 ENCOUNTER — Other Ambulatory Visit: Payer: Self-pay | Admitting: Physician Assistant

## 2022-10-14 DIAGNOSIS — J3089 Other allergic rhinitis: Secondary | ICD-10-CM

## 2022-11-14 ENCOUNTER — Ambulatory Visit: Payer: 59 | Admitting: Physician Assistant

## 2022-11-14 ENCOUNTER — Encounter: Payer: Self-pay | Admitting: Physician Assistant

## 2022-11-14 VITALS — BP 113/62 | HR 61 | Ht 71.0 in | Wt 326.0 lb

## 2022-11-14 DIAGNOSIS — E785 Hyperlipidemia, unspecified: Secondary | ICD-10-CM | POA: Diagnosis not present

## 2022-11-14 DIAGNOSIS — I10 Essential (primary) hypertension: Secondary | ICD-10-CM | POA: Diagnosis not present

## 2022-11-14 DIAGNOSIS — Z1211 Encounter for screening for malignant neoplasm of colon: Secondary | ICD-10-CM

## 2022-11-14 MED ORDER — LISINOPRIL 10 MG PO TABS: 10.0000 mg | ORAL_TABLET | Freq: Every day | ORAL | 1 refills | Status: DC

## 2022-11-14 NOTE — Progress Notes (Signed)
Established Patient Office Visit  Subjective   Patient ID: Tanner Rodriguez, male    DOB: 05-Oct-1971  Age: 52 y.o. MRN: [id number]  Chief Complaint  Patient presents with   Follow-up   Hypertension    HPI Pt is a 52 yo obese male with HTN, HLD who presents to the clinic for 6 month follow up. He is doing great. He has lost 13lbs in last month on weight watchers. Taking medication. No SOB, palpitations, headaches, vision changes. He stopped smoking 1 month ago. He does have some intermittent chest tightness at rest but goes away. Normal stress test in the past. 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  .. Active Ambulatory Problems    Diagnosis Date Noted   Generalized anxiety disorder 12/23/2013   Seborrheic dermatitis 05/12/2014   OSA on CPAP 01/24/2015   Essential hypertension 05/23/2019   Morbid obesity ([place]) 05/23/2019   Rosacea 05/23/2019   Cervical spondylosis 07/13/2019   Cervical radiculopathy 07/13/2019   Hyperlipidemia 10/29/2020   Benign prostatic hyperplasia with weak urinary stream 05/03/2021   External thrombosed hemorrhoids 05/24/2021   Hemorrhoids 06/10/2021   Bright red blood per rectum 06/10/2021   Onychomadesis of toenail 05/09/2022   Resolved Ambulatory Problems    Diagnosis Date Noted   Chest tightness 05/23/2019   Chest pain 05/05/2021   Past Medical History:  Diagnosis Date   Hypertension      Review of Systems  All other systems reviewed and are negative.     Objective:     BP 113/62   Pulse 61   Ht 5\' 11"  (1.803 m)   Wt (!) 326 lb (147.9 kg)   SpO2 97%   BMI 45.47 kg/m  BP Readings from Last 3 Encounters:  11/14/22 113/62  05/09/22 (!) 103/55  11/08/21 124/70   Wt Readings from Last 3 Encounters:  11/14/22 (!) 326 lb (147.9 kg)  05/09/22 (!) 319 lb (144.7 kg)  11/08/21 (!) 319 lb (144.7 kg)      Physical Exam Constitutional:      Appearance: Normal appearance. He is obese.  HENT:     Head:  Normocephalic.  Cardiovascular:     Rate and Rhythm: Normal rate and regular rhythm.     Heart sounds: No murmur heard. Pulmonary:     Effort: Pulmonary effort is normal.     Breath sounds: Normal breath sounds.  Musculoskeletal:     Right lower leg: No edema.     Left lower leg: No edema.  Neurological:     General: No focal deficit present.     Mental Status: He is alert and oriented to person, place, and time.  Psychiatric:        Mood and Affect: Mood normal.        Assessment & Plan:  Marland KitchenMarland KitchenAndrick was seen today for follow-up and hypertension.  Diagnoses and all orders for this visit:  Essential hypertension -     COMPLETE METABOLIC PANEL WITH GFR -     lisinopril (ZESTRIL) 10 MG tablet; Take 1 tablet (10 mg total) by mouth daily.  Hyperlipidemia, unspecified hyperlipidemia type -     lisinopril (ZESTRIL) 10 MG tablet; Take 1 tablet (10 mg total) by mouth daily.  Colon cancer screening -     Ambulatory referral to Gastroenterology  Morbid obesity ([place])   BP looks great.  Refilled lisinopril Cmp ordered Colonoscopy ordered Continue to work on diet and exercise [place] job on smoking cessation  Return in about 6 months (around 05/15/2023).    Iran Planas, PA-C

## 2022-11-15 LAB — COMPLETE METABOLIC PANEL WITH GFR
AG Ratio: 1.7 (calc) (ref 1.0–2.5)
ALT: 27 U/L (ref 9–46)
AST: 22 U/L (ref 10–35)
Albumin: 4.3 g/dL (ref 3.6–5.1)
Alkaline phosphatase (APISO): 59 U/L (ref 35–144)
BUN: 10 mg/dL (ref 7–25)
CO2: 26 mmol/L (ref 20–32)
Calcium: 9.7 mg/dL (ref 8.6–10.3)
Chloride: 104 mmol/L (ref 98–110)
Creat: 1.08 mg/dL (ref 0.70–1.30)
Globulin: 2.5 g/dL (calc) (ref 1.9–3.7)
Glucose, Bld: 97 mg/dL (ref 65–99)
Potassium: 4.4 mmol/L (ref 3.5–5.3)
Sodium: 140 mmol/L (ref 135–146)
Total Bilirubin: 1.4 mg/dL — ABNORMAL HIGH (ref 0.2–1.2)
Total Protein: 6.8 g/dL (ref 6.1–8.1)
eGFR: 82 mL/min/{1.73_m2} (ref >=60)

## 2022-11-17 NOTE — Progress Notes (Signed)
Glucose, kidney, liver look good. Bilirubin up but not concerning.

## 2022-12-23 ENCOUNTER — Other Ambulatory Visit: Payer: Self-pay | Admitting: Physician Assistant

## 2022-12-23 DIAGNOSIS — J3089 Other allergic rhinitis: Secondary | ICD-10-CM

## 2023-02-09 ENCOUNTER — Other Ambulatory Visit: Payer: Self-pay | Admitting: Physician Assistant

## 2023-02-09 DIAGNOSIS — J3089 Other allergic rhinitis: Secondary | ICD-10-CM

## 2023-03-19 ENCOUNTER — Other Ambulatory Visit: Payer: Self-pay | Admitting: Physician Assistant

## 2023-03-19 DIAGNOSIS — J3089 Other allergic rhinitis: Secondary | ICD-10-CM

## 2023-04-28 ENCOUNTER — Other Ambulatory Visit: Payer: Self-pay | Admitting: Physician Assistant

## 2023-04-28 DIAGNOSIS — J3089 Other allergic rhinitis: Secondary | ICD-10-CM

## 2023-05-01 ENCOUNTER — Other Ambulatory Visit: Payer: Self-pay | Admitting: Physician Assistant

## 2023-05-01 DIAGNOSIS — I10 Essential (primary) hypertension: Secondary | ICD-10-CM

## 2023-05-01 DIAGNOSIS — E785 Hyperlipidemia, unspecified: Secondary | ICD-10-CM

## 2023-05-15 ENCOUNTER — Ambulatory Visit: Payer: 59 | Admitting: Physician Assistant

## 2023-05-15 ENCOUNTER — Encounter: Payer: Self-pay | Admitting: Physician Assistant

## 2023-05-15 VITALS — BP 120/57 | HR 60 | Ht 71.0 in | Wt 314.0 lb

## 2023-05-15 DIAGNOSIS — Z1211 Encounter for screening for malignant neoplasm of colon: Secondary | ICD-10-CM | POA: Diagnosis not present

## 2023-05-15 DIAGNOSIS — Z6841 Body Mass Index (BMI) 40.0 and over, adult: Secondary | ICD-10-CM | POA: Diagnosis not present

## 2023-05-15 DIAGNOSIS — I10 Essential (primary) hypertension: Secondary | ICD-10-CM

## 2023-05-15 DIAGNOSIS — F17200 Nicotine dependence, unspecified, uncomplicated: Secondary | ICD-10-CM

## 2023-05-15 DIAGNOSIS — Z122 Encounter for screening for malignant neoplasm of respiratory organs: Secondary | ICD-10-CM

## 2023-05-15 DIAGNOSIS — Z125 Encounter for screening for malignant neoplasm of prostate: Secondary | ICD-10-CM | POA: Diagnosis not present

## 2023-05-15 DIAGNOSIS — G4733 Obstructive sleep apnea (adult) (pediatric): Secondary | ICD-10-CM

## 2023-05-15 DIAGNOSIS — E785 Hyperlipidemia, unspecified: Secondary | ICD-10-CM | POA: Diagnosis not present

## 2023-05-15 DIAGNOSIS — L219 Seborrheic dermatitis, unspecified: Secondary | ICD-10-CM | POA: Diagnosis not present

## 2023-05-15 DIAGNOSIS — R7301 Impaired fasting glucose: Secondary | ICD-10-CM

## 2023-05-15 MED ORDER — LISINOPRIL 10 MG PO TABS: 10.0000 mg | ORAL_TABLET | Freq: Every day | ORAL | 1 refills | Status: DC

## 2023-05-15 MED ORDER — KETOCONAZOLE 2 % EX CREA
TOPICAL_CREAM | CUTANEOUS | 0 refills | Status: DC
Start: 2023-05-15 — End: 2023-06-18

## 2023-05-15 NOTE — Progress Notes (Signed)
Established Patient Office Visit  Subjective   Patient ID: Tanner Rodriguez, male    DOB: September 05, 1971  Age: 52 y.o. MRN: [id number]  Chief Complaint  Patient presents with   Hypertension    HPI  Pt is a 52 yo obese male with HTN, OSA, HLD who presents to the clinic for 6 month follow up.   He is doing well. He did start back smoking. His wife started back so he did. Denies any SOB or CP. He is taking his medication daily. He is working on diet and lost another 12lbs. He is not exercising.   Does not remember being called by gastroenterology to set up colonoscopy.   Pt is using CPAP nightly. He does not miss a night. He is not having any problems with machine.   .. Active Ambulatory Problems    Diagnosis Date Noted   Generalized anxiety disorder 12/23/2013   Seborrheic dermatitis 05/12/2014   OSA on CPAP 01/24/2015   Essential hypertension 05/23/2019   Morbid obesity (HCC) 05/23/2019   Rosacea 05/23/2019   Cervical spondylosis 07/13/2019   Cervical radiculopathy 07/13/2019   Hyperlipidemia 10/29/2020   Benign prostatic hyperplasia with weak urinary stream 05/03/2021   External thrombosed hemorrhoids 05/24/2021   Hemorrhoids 06/10/2021   Bright red blood per rectum 06/10/2021   Onychomadesis of toenail 05/09/2022   Resolved Ambulatory Problems    Diagnosis Date Noted   Chest tightness 05/23/2019   Chest pain 05/05/2021   Past Medical History:  Diagnosis Date   Hypertension      Review of Systems  All other systems reviewed and are negative.     Objective:     BP (!) 120/57   Pulse 60   Ht 5\' 11"  (1.803 m)   Wt (!) 314 lb (142.4 kg)   SpO2 97%   BMI 43.79 kg/m  BP Readings from Last 3 Encounters:  05/15/23 (!) 120/57  11/14/22 113/62  05/09/22 (!) 103/55   Wt Readings from Last 3 Encounters:  05/15/23 (!) 314 lb (142.4 kg)  11/14/22 (!) 326 lb (147.9 kg)  05/09/22 (!) 319 lb (144.7 kg)    ..    11/14/2022    8:16 AM 05/09/2022    8:07 AM  11/08/2021    8:04 AM 06/28/2021    4:25 PM 05/03/2021   10:07 AM  Depression screen PHQ 2/9  Decreased Interest 0 0 0 0 0  Down, Depressed, Hopeless 0 0 0 0 0  PHQ - 2 Score 0 0 0 0 0  Altered sleeping     1  Tired, decreased energy     1  Change in appetite     0  Feeling bad or failure about yourself      0  Trouble concentrating     0  Moving slowly or fidgety/restless     0  Suicidal thoughts     0  PHQ-9 Score     2  Difficult doing work/chores     Not difficult at all     Physical Exam Constitutional:      Appearance: Normal appearance. He is obese.  HENT:     Head: Normocephalic.  Neck:     Vascular: No carotid bruit.  Cardiovascular:     Rate and Rhythm: Normal rate and regular rhythm.  Pulmonary:     Effort: Pulmonary effort is normal.     Breath sounds: Normal breath sounds.  Musculoskeletal:     Cervical back: Normal range of motion  and neck supple.     Right lower leg: No edema.     Left lower leg: No edema.  Neurological:     General: No focal deficit present.     Mental Status: He is alert and oriented to person, place, and time.  Psychiatric:        Mood and Affect: Mood normal.       The 10-year ASCVD risk score (Arnett DK, et al., 2019) is: 7.4%    Assessment & Plan:  Marland KitchenMarland KitchenSamanyu was seen today for hypertension.  Diagnoses and all orders for this visit:  Essential hypertension -     lisinopril (ZESTRIL) 10 MG tablet; Take 1 tablet (10 mg total) by mouth daily. -     CMP14+EGFR -     CBC w/Diff/Platelet  Hyperlipidemia, unspecified hyperlipidemia type -     lisinopril (ZESTRIL) 10 MG tablet; Take 1 tablet (10 mg total) by mouth daily. -     Lipid panel -     CBC w/Diff/Platelet  Elevated fasting glucose -     Hemoglobin A1c  Prostate cancer screening -     PSA  Class 3 severe obesity due to excess calories without serious comorbidity with body mass index (BMI) of 40.0 to 44.9 in adult (HCC) -     TSH  Seborrheic dermatitis -      ketoconazole (NIZORAL) 2 % cream; APPLY  CREAM TOPICALLY TO AFFECTED AREA TWICE DAILY  Screening for malignant neoplasm of colon -     Ambulatory referral to Gastroenterology  Current smoker -     Ambulatory Referral Lung Cancer Screening [facility] [facility]  Screening for lung cancer -     Ambulatory Referral Lung Cancer Screening  Pulmonary  OSA on CPAP   BP looks GREAT.  Refilled lisinopril.  Fasting labs ordered Discussed need for colonoscopy, another referral was made for in [place] that might be easier to get to.  Pt aware there is documentation that [facility] did try to schedule with him Started back smoking, referral for lung cancer CT screening Pt feels like he cannot quit unless his wife does and she is not Declined shingles vaccines Pt continues to lose weight with diet, keep up the good work Continue using CPAP nightly    Return in about 6 months (around 11/15/2023).    Tandy Gaw, PA-C

## 2023-05-18 NOTE — Progress Notes (Signed)
[place],   A1C normal range but up some from one year ago.  PSA normal.  Thyroid looks great.  Cholesterol looks wonderful.  Kidney, liver, glucose look good.

## 2023-06-06 ENCOUNTER — Other Ambulatory Visit: Payer: Self-pay | Admitting: Physician Assistant

## 2023-06-06 DIAGNOSIS — J3089 Other allergic rhinitis: Secondary | ICD-10-CM

## 2023-06-15 ENCOUNTER — Other Ambulatory Visit: Payer: Self-pay | Admitting: Physician Assistant

## 2023-06-15 DIAGNOSIS — I10 Essential (primary) hypertension: Secondary | ICD-10-CM

## 2023-06-15 DIAGNOSIS — E785 Hyperlipidemia, unspecified: Secondary | ICD-10-CM

## 2023-06-16 ENCOUNTER — Other Ambulatory Visit: Payer: Self-pay | Admitting: Physician Assistant

## 2023-06-16 DIAGNOSIS — L219 Seborrheic dermatitis, unspecified: Secondary | ICD-10-CM

## 2023-06-18 ENCOUNTER — Other Ambulatory Visit: Payer: Self-pay | Admitting: Physician Assistant

## 2023-06-18 DIAGNOSIS — L219 Seborrheic dermatitis, unspecified: Secondary | ICD-10-CM

## 2023-06-18 NOTE — Telephone Encounter (Signed)
Requesting rx rf of ketoconazole 2% Last written 05/15/2023 Last OV 05/15/2023 Upcoming appt 11/20/2023

## 2023-06-19 DIAGNOSIS — S0101XA Laceration without foreign body of scalp, initial encounter: Secondary | ICD-10-CM | POA: Diagnosis not present

## 2023-06-19 DIAGNOSIS — X58XXXA Exposure to other specified factors, initial encounter: Secondary | ICD-10-CM | POA: Diagnosis not present

## 2023-06-19 MED ORDER — KETOCONAZOLE 2 % EX CREA
TOPICAL_CREAM | CUTANEOUS | 1 refills | Status: DC
Start: 2023-06-19 — End: 2023-10-12

## 2023-08-13 ENCOUNTER — Other Ambulatory Visit: Payer: Self-pay | Admitting: Physician Assistant

## 2023-08-13 DIAGNOSIS — J3089 Other allergic rhinitis: Secondary | ICD-10-CM

## 2023-09-13 ENCOUNTER — Other Ambulatory Visit: Payer: Self-pay | Admitting: Physician Assistant

## 2023-09-13 DIAGNOSIS — E785 Hyperlipidemia, unspecified: Secondary | ICD-10-CM

## 2023-09-13 DIAGNOSIS — I10 Essential (primary) hypertension: Secondary | ICD-10-CM

## 2023-10-10 ENCOUNTER — Other Ambulatory Visit: Payer: Self-pay | Admitting: Physician Assistant

## 2023-10-10 DIAGNOSIS — L219 Seborrheic dermatitis, unspecified: Secondary | ICD-10-CM

## 2023-10-11 ENCOUNTER — Other Ambulatory Visit: Payer: Self-pay | Admitting: Physician Assistant

## 2023-10-11 DIAGNOSIS — J3089 Other allergic rhinitis: Secondary | ICD-10-CM

## 2023-11-06 ENCOUNTER — Other Ambulatory Visit: Payer: Self-pay | Admitting: Physician Assistant

## 2023-11-06 DIAGNOSIS — L219 Seborrheic dermatitis, unspecified: Secondary | ICD-10-CM

## 2023-11-20 ENCOUNTER — Ambulatory Visit (INDEPENDENT_AMBULATORY_CARE_PROVIDER_SITE_OTHER): Payer: 59 | Admitting: Physician Assistant

## 2023-11-20 ENCOUNTER — Encounter: Payer: Self-pay | Admitting: Physician Assistant

## 2023-11-20 VITALS — BP 130/58 | HR 66 | Ht 71.0 in | Wt 319.0 lb

## 2023-11-20 DIAGNOSIS — Z6841 Body Mass Index (BMI) 40.0 and over, adult: Secondary | ICD-10-CM

## 2023-11-20 DIAGNOSIS — G4733 Obstructive sleep apnea (adult) (pediatric): Secondary | ICD-10-CM

## 2023-11-20 DIAGNOSIS — F17200 Nicotine dependence, unspecified, uncomplicated: Secondary | ICD-10-CM | POA: Insufficient documentation

## 2023-11-20 DIAGNOSIS — Z1211 Encounter for screening for malignant neoplasm of colon: Secondary | ICD-10-CM

## 2023-11-20 DIAGNOSIS — E66813 Obesity, class 3: Secondary | ICD-10-CM | POA: Diagnosis not present

## 2023-11-20 DIAGNOSIS — Z8249 Family history of ischemic heart disease and other diseases of the circulatory system: Secondary | ICD-10-CM

## 2023-11-20 DIAGNOSIS — E785 Hyperlipidemia, unspecified: Secondary | ICD-10-CM | POA: Diagnosis not present

## 2023-11-20 DIAGNOSIS — I10 Essential (primary) hypertension: Secondary | ICD-10-CM | POA: Diagnosis not present

## 2023-11-20 HISTORY — DX: Nicotine dependence, unspecified, uncomplicated: F17.200

## 2023-11-20 HISTORY — DX: Family history of ischemic heart disease and other diseases of the circulatory system: Z82.49

## 2023-11-20 NOTE — Patient Instructions (Addendum)
Will order CT calcium score  Stroke Prevention Some medical conditions and behaviors can lead to a higher chance of having a stroke. You can help prevent a stroke by eating healthy, exercising, not smoking, and managing any medical conditions you have. Stroke is a leading cause of functional impairment. Primary prevention is particularly important because a majority of strokes are first-time events. Stroke changes the lives of not only those who experience a stroke but also their family and other caregivers. How can this condition affect me? A stroke is a medical emergency and should be treated right away. A stroke can lead to brain damage and can sometimes be life-threatening. If a person gets medical treatment right away, there is a better chance of surviving and recovering from a stroke. What can increase my risk? The following medical conditions may increase your risk of a stroke: Cardiovascular disease. High blood pressure (hypertension). Diabetes. High cholesterol. Sickle cell disease. Blood clotting disorders (hypercoagulable state). Obesity. Sleep disorders (obstructive sleep apnea). Other risk factors include: Being older than age 35. Having a history of blood clots, stroke, or mini-stroke (transient ischemic attack, TIA). Genetic factors, such as race, ethnicity, or a family history of stroke. Smoking cigarettes or using other tobacco products. Taking birth control pills, especially if you also use tobacco. Heavy use of alcohol or drugs, especially cocaine and methamphetamine. Physical inactivity. What actions can I take to prevent this? Manage your health conditions High cholesterol levels. Eating a healthy diet is important for preventing high cholesterol. If cholesterol cannot be managed through diet alone, you may need to take medicines. Take any prescribed medicines to control your cholesterol as told by your health care provider. Hypertension. To reduce your risk of  stroke, try to keep your blood pressure below 130/80. Eating a healthy diet and exercising regularly are important for controlling blood pressure. If these steps are not enough to manage your blood pressure, you may need to take medicines. Take any prescribed medicines to control hypertension as told by your health care provider. Ask your health care provider if you should monitor your blood pressure at home. Have your blood pressure checked every year, even if your blood pressure is normal. Blood pressure increases with age and some medical conditions. Diabetes. Eating a healthy diet and exercising regularly are important parts of managing your blood sugar (glucose). If your blood sugar cannot be managed through diet and exercise, you may need to take medicines. Take any prescribed medicines to control your diabetes as told by your health care provider. Get evaluated for obstructive sleep apnea. Talk to your health care provider about getting a sleep evaluation if you snore a lot or have excessive sleepiness. Make sure that any other medical conditions you have, such as atrial fibrillation or atherosclerosis, are managed. Nutrition Follow instructions from your health care provider about what to eat or drink to help manage your health condition. These instructions may include: Reducing your daily calorie intake. Limiting how much salt (sodium) you use to 1,500 milligrams (mg) each day. Using only healthy fats for cooking, such as olive oil, canola oil, or sunflower oil. Eating healthy foods. You can do this by: Choosing foods that are high in fiber, such as whole grains, and fresh fruits and vegetables. Eating at least 5 servings of fruits and vegetables a day. Try to fill one-half of your plate with fruits and vegetables at each meal. Choosing lean protein foods, such as lean cuts of meat, poultry without skin, fish, tofu, beans, and  nuts. Eating low-fat dairy products. Avoiding foods that are  high in sodium. This can help lower blood pressure. Avoiding foods that have saturated fat, trans fat, and cholesterol. This can help prevent high cholesterol. Avoiding processed and prepared foods. Counting your daily carbohydrate intake.  Lifestyle If you drink alcohol: Limit how much you have to: 0-1 drink a day for women who are not pregnant. 0-2 drinks a day for men. Know how much alcohol is in your drink. In the [place] one drink equals one 12 oz bottle of beer ( ), one 5 oz glass of wine ( ), or one 1 oz glass of hard liquor (44mL). Do not use any products that contain nicotine or tobacco. These products include cigarettes, chewing tobacco, and vaping devices, such as e-cigarettes. If you need help quitting, ask your health care provider. Avoid secondhand smoke. Do not use drugs. Activity  Try to stay at a healthy weight. Get at least 30 minutes of exercise on most days, such as: Fast walking. Biking. Swimming. Medicines Take over-the-counter and prescription medicines only as told by your health care provider. Aspirin or blood thinners (antiplatelets or anticoagulants) may be recommended to reduce your risk of forming blood clots that can lead to stroke. Avoid taking birth control pills. Talk to your health care provider about the risks of taking birth control pills if: You are over 46 years old. You smoke. You get very bad headaches. You have had a blood clot. Where to find more information American Stroke Association: www.strokeassociation.org Get help right away if: You or a loved one has any symptoms of a stroke. "BE FAST" is an easy way to remember the main warning signs of a stroke: B - Balance. Signs are dizziness, sudden trouble walking, or loss of balance. E - Eyes. Signs are trouble seeing or a sudden change in vision. F - Face. Signs are sudden weakness or numbness of the face, or the face or eyelid drooping on one side. A - Arms. Signs are weakness or  numbness in an arm. This happens suddenly and usually on one side of the body. S - Speech. Signs are sudden trouble speaking, slurred speech, or trouble understanding what people say. T - Time. Time to call emergency services. Write down what time symptoms started. You or a loved one has other signs of a stroke, such as: A sudden, severe headache with no known cause. Nausea or vomiting. Seizure. These symptoms may represent a serious problem that is an emergency. Do not wait to see if the symptoms will go away. Get medical help right away. Call your local emergency services (911 in the [place] Do not drive yourself to the hospital. Summary You can help to prevent a stroke by eating healthy, exercising, not smoking, limiting alcohol intake, and managing any medical conditions you may have. Do not use any products that contain nicotine or tobacco. These include cigarettes, chewing tobacco, and vaping devices, such as e-cigarettes. If you need help quitting, ask your health care provider. Remember "BE FAST" for warning signs of a stroke. Get help right away if you or a loved one has any of these signs. This information is not intended to replace advice given to you by your health care provider. Make sure you discuss any questions you have with your health care provider. Document Revised: 09/08/2022 Document Reviewed: 09/08/2022 Elsevier Patient Education  2024 [employer].

## 2023-11-20 NOTE — Progress Notes (Signed)
Established Patient Office Visit  Subjective   Patient ID: Tanner Rodriguez, male    DOB: 01/01/1971  Age: 53 y.o. MRN: [id number]  Chief Complaint  Patient presents with   Medical Management of Chronic Issues    6 mo fup essential hypertension    HPI Pt is a 53 yo obese male who presents to the clinic for 6 month follow up.   Pt is compliant with all medications and does not need refills right now. He denies any CP, palpitations, headaches or vision changes. He is active but does not exercise.   He continues to smoke daily. He has 2 brothers and they have both had strokes and one had to have open heart surgery. He wonders if there is anything he can do early to prevent this from happening to him.   He was never scheduled for colonoscopy.   .. Active Ambulatory Problems    Diagnosis Date Noted   Generalized anxiety disorder 12/23/2013   Seborrheic dermatitis 05/12/2014   OSA on CPAP 01/24/2015   Essential hypertension 05/23/2019   Morbid obesity (HCC) 05/23/2019   Rosacea 05/23/2019   Cervical spondylosis 07/13/2019   Cervical radiculopathy 07/13/2019   Hyperlipidemia 10/29/2020   Benign prostatic hyperplasia with weak urinary stream 05/03/2021   External thrombosed hemorrhoids 05/24/2021   Hemorrhoids 06/10/2021   Bright red blood per rectum 06/10/2021   Onychomadesis of toenail 05/09/2022   Resolved Ambulatory Problems    Diagnosis Date Noted   Chest tightness 05/23/2019   Chest pain 05/05/2021   Past Medical History:  Diagnosis Date   Hypertension      Review of Systems  All other systems reviewed and are negative.     Objective:     BP (!) 130/58   Pulse 66   Ht 5\' 11"  (1.803 m)   Wt (!) 319 lb (144.7 kg)   SpO2 96%   BMI 44.49 kg/m  BP Readings from Last 3 Encounters:  11/20/23 (!) 130/58  05/15/23 (!) 120/57  11/14/22 113/62   Wt Readings from Last 3 Encounters:  11/20/23 (!) 319 lb (144.7 kg)  05/15/23 (!) 314 lb (142.4 kg)  11/14/22 (!)  326 lb (147.9 kg)      Physical Exam Constitutional:      Appearance: Normal appearance. He is obese.  HENT:     Head: Normocephalic.  Cardiovascular:     Rate and Rhythm: Normal rate and regular rhythm.     Pulses: Normal pulses.     Heart sounds: Normal heart sounds.  Pulmonary:     Effort: Pulmonary effort is normal.     Breath sounds: Normal breath sounds.  Musculoskeletal:     Right lower leg: No edema.     Left lower leg: No edema.  Neurological:     General: No focal deficit present.     Mental Status: He is alert and oriented to person, place, and time.  Psychiatric:        Mood and Affect: Mood normal.         Assessment & Plan:  Marland KitchenMarland KitchenYordan was seen today for medical management of chronic issues.  Diagnoses and all orders for this visit:  Essential hypertension -     CMP14+EGFR -     CT CARDIAC SCORING (SELF PAY ONLY); Future  OSA on CPAP  Class 3 severe obesity due to excess calories without serious comorbidity with body mass index (BMI) of 40.0 to 44.9 in adult [facility]) -     CMP14+EGFR -  CT CARDIAC SCORING (SELF PAY ONLY); Future  Hyperlipidemia, unspecified hyperlipidemia type -     CT CARDIAC SCORING (SELF PAY ONLY); Future  Colon cancer screening -     Cologuard  Family history of early CAD -     CT CARDIAC SCORING (SELF PAY ONLY); Future  Current smoker -     CT CARDIAC SCORING (SELF PAY ONLY); Future    BP to goal Normal stress test 05/2021 LDL 68 04/2023, on statin and ASA 81mg  Concerned about CV risk due to family history Cmp ordered CT calcium score ordered Continue on lisinopril Continue using CPAP Consider smoking cessation, not ready to quit until his wife does  Ordered cologuard   [employer], PA-C

## 2023-11-21 LAB — CMP14+EGFR
ALT: 21 [IU]/L (ref 0–44)
AST: 19 [IU]/L (ref 0–40)
Albumin: 4.2 g/dL (ref 3.8–4.9)
Alkaline Phosphatase: 69 [IU]/L (ref 44–121)
BUN/Creatinine Ratio: 10 (ref 9–20)
BUN: 11 mg/dL (ref 6–24)
Bilirubin Total: 1.1 mg/dL (ref 0.0–1.2)
CO2: 20 mmol/L (ref 20–29)
Calcium: 9.2 mg/dL (ref 8.7–10.2)
Chloride: 105 mmol/L (ref 96–106)
Creatinine, Ser: 1.06 mg/dL (ref 0.76–1.27)
Globulin, Total: 2.5 g/dL (ref 1.5–4.5)
Glucose: 103 mg/dL — ABNORMAL HIGH (ref 70–99)
Potassium: 4.4 mmol/L (ref 3.5–5.2)
Sodium: 139 mmol/L (ref 134–144)
Total Protein: 6.7 g/dL (ref 6.0–8.5)
eGFR: 83 mL/min/{1.73_m2} (ref >59)

## 2023-11-23 ENCOUNTER — Encounter: Payer: Self-pay | Admitting: Physician Assistant

## 2023-11-23 NOTE — Progress Notes (Signed)
Kidney and liver look great!!!!

## 2023-11-25 ENCOUNTER — Ambulatory Visit (INDEPENDENT_AMBULATORY_CARE_PROVIDER_SITE_OTHER): Payer: Self-pay

## 2023-11-25 DIAGNOSIS — Z8249 Family history of ischemic heart disease and other diseases of the circulatory system: Secondary | ICD-10-CM

## 2023-11-25 DIAGNOSIS — Z6841 Body Mass Index (BMI) 40.0 and over, adult: Secondary | ICD-10-CM

## 2023-11-25 DIAGNOSIS — E785 Hyperlipidemia, unspecified: Secondary | ICD-10-CM

## 2023-11-25 DIAGNOSIS — E66813 Obesity, class 3: Secondary | ICD-10-CM

## 2023-11-25 DIAGNOSIS — F17200 Nicotine dependence, unspecified, uncomplicated: Secondary | ICD-10-CM

## 2023-11-25 DIAGNOSIS — I10 Essential (primary) hypertension: Secondary | ICD-10-CM

## 2023-11-27 ENCOUNTER — Encounter: Payer: Self-pay | Admitting: Physician Assistant

## 2023-11-27 ENCOUNTER — Other Ambulatory Visit: Payer: Self-pay | Admitting: Physician Assistant

## 2023-11-27 DIAGNOSIS — R931 Abnormal findings on diagnostic imaging of heart and coronary circulation: Secondary | ICD-10-CM

## 2023-11-27 DIAGNOSIS — Z8249 Family history of ischemic heart disease and other diseases of the circulatory system: Secondary | ICD-10-CM

## 2023-11-27 DIAGNOSIS — E782 Mixed hyperlipidemia: Secondary | ICD-10-CM

## 2023-11-27 DIAGNOSIS — I10 Essential (primary) hypertension: Secondary | ICD-10-CM

## 2023-11-27 NOTE — Progress Notes (Signed)
 Rudell,   You certainly have an elevated calcium  score and thus increased risk of cardiovascular event. Make sure taking lipitor. I would like to refer you to cardiology for possible further work up and to see if anything else we need to be doing to prevent you from having CV event.

## 2023-11-29 ENCOUNTER — Other Ambulatory Visit: Payer: Self-pay | Admitting: Physician Assistant

## 2023-11-29 DIAGNOSIS — J3089 Other allergic rhinitis: Secondary | ICD-10-CM

## 2023-12-06 ENCOUNTER — Other Ambulatory Visit: Payer: Self-pay | Admitting: Physician Assistant

## 2023-12-06 DIAGNOSIS — E785 Hyperlipidemia, unspecified: Secondary | ICD-10-CM

## 2023-12-06 DIAGNOSIS — I10 Essential (primary) hypertension: Secondary | ICD-10-CM

## 2023-12-06 DIAGNOSIS — L219 Seborrheic dermatitis, unspecified: Secondary | ICD-10-CM

## 2024-01-05 ENCOUNTER — Other Ambulatory Visit: Payer: Self-pay | Admitting: Physician Assistant

## 2024-01-05 DIAGNOSIS — J3089 Other allergic rhinitis: Secondary | ICD-10-CM

## 2024-01-07 ENCOUNTER — Other Ambulatory Visit: Payer: Self-pay | Admitting: Physician Assistant

## 2024-01-07 DIAGNOSIS — L219 Seborrheic dermatitis, unspecified: Secondary | ICD-10-CM

## 2024-01-13 DIAGNOSIS — I10 Essential (primary) hypertension: Secondary | ICD-10-CM | POA: Insufficient documentation

## 2024-01-20 ENCOUNTER — Other Ambulatory Visit: Payer: Self-pay | Admitting: Physician Assistant

## 2024-01-20 DIAGNOSIS — I10 Essential (primary) hypertension: Secondary | ICD-10-CM

## 2024-01-20 DIAGNOSIS — E785 Hyperlipidemia, unspecified: Secondary | ICD-10-CM

## 2024-01-21 ENCOUNTER — Encounter: Payer: Self-pay | Admitting: Cardiology

## 2024-01-21 ENCOUNTER — Ambulatory Visit: Payer: 59 | Attending: Cardiology | Admitting: Cardiology

## 2024-01-21 VITALS — BP 122/74 | HR 63 | Ht 71.0 in | Wt 325.0 lb

## 2024-01-21 DIAGNOSIS — I10 Essential (primary) hypertension: Secondary | ICD-10-CM

## 2024-01-21 DIAGNOSIS — E782 Mixed hyperlipidemia: Secondary | ICD-10-CM

## 2024-01-21 DIAGNOSIS — F17200 Nicotine dependence, unspecified, uncomplicated: Secondary | ICD-10-CM | POA: Diagnosis not present

## 2024-01-21 DIAGNOSIS — G4733 Obstructive sleep apnea (adult) (pediatric): Secondary | ICD-10-CM | POA: Diagnosis not present

## 2024-01-21 DIAGNOSIS — R011 Cardiac murmur, unspecified: Secondary | ICD-10-CM

## 2024-01-21 NOTE — Patient Instructions (Signed)
 Medication Instructions:  Your physician recommends that you continue on your current medications as directed. Please refer to the Current Medication list given to you today.  *If you need a refill on your cardiac medications before your next appointment, please call your pharmacy*  Lab Work: None If you have labs (blood work) drawn today and your tests are completely normal, you will receive your results only by: MyChart Message (if you have MyChart) OR A paper copy in the mail If you have any lab test that is abnormal or we need to change your treatment, we will call you to review the results.  Testing/Procedures: Your physician has requested that you have an echocardiogram. Echocardiography is a painless test that uses sound waves to create images of your heart. It provides your doctor with information about the size and shape of your heart and how well your heart's chambers and valves are working. This procedure takes approximately one hour. There are no restrictions for this procedure. Please do NOT wear cologne, perfume, aftershave, or lotions (deodorant is allowed). Please arrive 15 minutes prior to your appointment time.  Please note: We ask at that you not bring children with you during ultrasound (echo/ vascular) testing. Due to room size and safety concerns, children are not allowed in the ultrasound rooms during exams. Our front office staff cannot provide observation of children in our lobby area while testing is being conducted. An adult accompanying a patient to their appointment will only be allowed in the ultrasound room at the discretion of the ultrasound technician under special circumstances. We apologize for any inconvenience.   Follow-Up: At [facility], you and your health needs are our priority.  As part of our continuing mission to provide you with exceptional heart care, our providers are all part of one team.  This team includes your primary Cardiologist  (physician) and Advanced Practice Providers or APPs (Physician Assistants and Nurse Practitioners) who all work together to provide you with the care you need, when you need it.  Your next appointment:   6 month(s)  Provider:   Belva Crome, MD    We recommend signing up for the patient portal called "MyChart".  Sign up information is provided on this After Visit Summary.  MyChart is used to connect with patients for Virtual Visits (Telemedicine).  Patients are able to view lab/test results, encounter notes, upcoming appointments, etc.  Non-urgent messages can be sent to your provider as well.   To learn more about what you can do with MyChart, go to [email or web address].   Other Instructions None

## 2024-01-21 NOTE — Progress Notes (Signed)
 Cardiology Office Note:    Date:  01/21/2024   ID:  Tanner Rodriguez, DOB 1971/10/14, MRN [id number]  PCP:  Jomarie Longs, PA-C  Cardiologist:  Garwin Brothers, MD   Referring MD: Jomarie Longs, PA-C    ASSESSMENT:    1. Mixed hyperlipidemia   2. Essential hypertension   3. OSA on CPAP   4. Morbid obesity (HCC)   5. Cardiac murmur    PLAN:    In order of problems listed above:  Aortic atherosclerosis: Secondary prevention stressed to the patient.  Importance of compliance with diet medication stressed any vocalized understanding.  He was advised to walk at least half an hour a day on a daily basis.  He is asymptomatic at this time with activities of daily living.  Calcium score was discussed with him. Essential hypertension: Blood pressure is stable and diet was emphasized.  Lifestyle modification urged Mixed dyslipidemia: On lipid-lowering medications and lipids are at goal.  Diet emphasized. Cigarette smoker: I spent 5 minutes with the patient discussing solely about smoking. Smoking cessation was counseled. I suggested to the patient also different medications and pharmacological interventions. Patient is keen to try stopping on its own at this time. He will get back to me if he needs any further assistance in this matter. Morbid obesity: Risks of obesity explained.  Diet emphasized.  Extensively discussed diet with him at length.  He promises to do better. Patient will be seen in follow-up appointment in 6 months or earlier if the patient has any concerns.    Medication Adjustments/Labs and Tests Ordered: Current medicines are reviewed at length with the patient today.  Concerns regarding medicines are outlined above.  Orders Placed This Encounter  Procedures   EKG 12-Lead   No orders of the defined types were placed in this encounter.    History of Present Illness:    Tanner Rodriguez is a 53 y.o. male who is being seen today for the evaluation of aortic  atherosclerosis at the request of Breeback, Jade L, PA-C.  Patient is a pleasant 53 year old male.  He has past medical history of essential hypertension, dyslipidemia, morbid obesity and sleep apnea.  He has family history of coronary artery disease.  His calcium score was fine but he has aortic atherosclerosis.  No chest pain orthopnea or PND.  His lipids are at goal.  He leads a sedentary lifestyle.  At the time of my evaluation, the patient is alert awake oriented and in no distress.  Past Medical History:  Diagnosis Date   Benign prostatic hyperplasia with weak urinary stream 05/03/2021   Bright red blood per rectum 06/10/2021   Cervical radiculopathy 07/13/2019   Cervical spondylosis 07/13/2019   Current smoker 11/20/2023   Essential hypertension 05/23/2019   External thrombosed hemorrhoids 05/24/2021   Family history of early CAD 11/20/2023   Generalized anxiety disorder 12/23/2013   Hemorrhoids 06/10/2021   Hyperlipidemia 10/29/2020   Hypertension    Morbid obesity (HCC) 05/23/2019   Onychomadesis of toenail 05/09/2022   OSA on CPAP 01/24/2015   AHI 21.21 January 2015.  9 cm H2O with heat and humidity using a ResMed AirFit P10 mask.     Rosacea 05/23/2019   Seborrheic dermatitis 05/12/2014    Past Surgical History:  Procedure Laterality Date   NO PAST SURGERIES      Current Medications: Current Meds  Medication Sig   albuterol (VENTOLIN HFA) 108 (90 Base) MCG/ACT inhaler INHALE 2 PUFFS BY MOUTH EVERY 4 HOURS  AS NEEDED FOR WHEEZING AND FOR SHORTNESS OF BREATH   AMBULATORY NON FORMULARY MEDICATION Portable CPAP machine and supplies: 9 cm H2O with heat and humidity using a ResMed AirFit P10 mask. Dx: OSA   atorvastatin (LIPITOR) 40 MG tablet Take 1 tablet by mouth once daily   ketoconazole (NIZORAL) 2 % cream APPLY  CREAM TOPICALLY TO AFFECTED AREA TWICE DAILY   lisinopril (ZESTRIL) 10 MG tablet Take 1 tablet by mouth once daily     Allergies:   Patient has no known  allergies.   Social History   Socioeconomic History   Marital status: Married    Spouse name: Not on file   Number of children: Not on file   Years of education: Not on file   Highest education level: 8th grade  Occupational History   Not on file  Tobacco Use   Smoking status: Every Day    Current packs/day: 0.00    Average packs/day: 0.5 packs/day for 35.0 years (17.5 ttl pk-yrs)    Types: Cigarettes    Start date: 10/14/1987    Last attempt to quit: 10/13/2022    Years since quitting: 1.2   Smokeless tobacco: Former    Types: Snuff   Tobacco comments:    smokes on the weekends,dips snuff during the week  Substance and Sexual Activity   Alcohol use: Yes    Comment: rarely   Drug use: No   Sexual activity: Not Currently    Partners: Female  Other Topics Concern   Not on file  Social History Narrative   Not on file   Social Drivers of Health   Financial Resource Strain: Low Risk  (11/16/2023)   Overall Financial Resource Strain (CARDIA)    Difficulty of Paying Living Expenses: Not very hard  Food Insecurity: No Food Insecurity (11/16/2023)   Hunger Vital Sign    Worried About Running Out of Food in the Last Year: Never true    Ran Out of Food in the Last Year: Never true  Transportation Needs: No Transportation Needs (11/16/2023)   PRAPARE - Administrator, Civil Service (Medical): No    Lack of Transportation (Non-Medical): No  Physical Activity: Insufficiently Active (11/16/2023)   Exercise Vital Sign    Days of Exercise per Week: 2 days    Minutes of Exercise per Session: 20 min  Stress: Stress Concern Present (11/16/2023)   [employer] of Occupational Health - Occupational Stress Questionnaire    Feeling of Stress : Rather much  Social Connections: Unknown (11/16/2023)   Social Connection and Isolation Panel [NHANES]    Frequency of Communication with Friends and Family: More than three times a week    Frequency of Social Gatherings with Friends  and Family: Once a week    Attends Religious Services: Patient declined    Database administrator or Organizations: No    Attends Engineer, structural: Not on file    Marital Status: Married     Family History: The patient's family history includes Alcoholism in his father; Cancer in an other family member; Depression in an other family member; Diabetes in his mother; Heart attack in an other family member; Hyperlipidemia in his mother; Hypertension in an other family member.  ROS:   Please see the history of present illness.    All other systems reviewed and are negative.  EKGs/Labs/Other Studies Reviewed:    The following studies were reviewed today:  EKG reveals sinus rhythm and nonspecific ST-T changes  Recent Labs: 05/15/2023: Hemoglobin 15.7; Platelets 196; TSH 1.140 11/20/2023: ALT 21; BUN 11; Creatinine, Ser 1.06; Potassium 4.4; Sodium 139  Recent Lipid Panel    Component Value Date/Time   CHOL 129 05/15/2023 0822   TRIG 112 05/15/2023 0822   HDL 41 05/15/2023 0822   CHOLHDL 3.1 05/15/2023 0822   CHOLHDL 3.4 05/09/2022 0000   LDLCALC 68 05/15/2023 0822   LDLCALC 77 05/09/2022 0000    Physical Exam:    VS:  BP 122/74   Pulse 63   Ht 5\' 11"  (1.803 m)   Wt (!) 325 lb (147.4 kg)   SpO2 96%   BMI 45.33 kg/m     Wt Readings from Last 3 Encounters:  01/21/24 (!) 325 lb (147.4 kg)  11/20/23 (!) 319 lb (144.7 kg)  05/15/23 (!) 314 lb (142.4 kg)     GEN: Patient is in no acute distress HEENT: Normal NECK: No JVD; No carotid bruits LYMPHATICS: No lymphadenopathy CARDIAC: S1 S2 regular, 2/6 systolic murmur at the apex. RESPIRATORY:  Clear to auscultation without rales, wheezing or rhonchi  ABDOMEN: Soft, non-tender, non-distended MUSCULOSKELETAL:  No edema; No deformity  SKIN: Warm and dry NEUROLOGIC:  Alert and oriented x 3 PSYCHIATRIC:  Normal affect    Signed, Garwin Brothers, MD  01/21/2024 9:18 AM    [facility]

## 2024-02-02 ENCOUNTER — Other Ambulatory Visit: Payer: Self-pay | Admitting: Physician Assistant

## 2024-02-02 DIAGNOSIS — L219 Seborrheic dermatitis, unspecified: Secondary | ICD-10-CM

## 2024-02-19 ENCOUNTER — Ambulatory Visit (HOSPITAL_COMMUNITY): Attending: Cardiology

## 2024-02-19 DIAGNOSIS — I10 Essential (primary) hypertension: Secondary | ICD-10-CM | POA: Insufficient documentation

## 2024-02-19 DIAGNOSIS — I340 Nonrheumatic mitral (valve) insufficiency: Secondary | ICD-10-CM

## 2024-02-19 DIAGNOSIS — E782 Mixed hyperlipidemia: Secondary | ICD-10-CM | POA: Insufficient documentation

## 2024-02-19 DIAGNOSIS — R011 Cardiac murmur, unspecified: Secondary | ICD-10-CM | POA: Diagnosis not present

## 2024-02-19 DIAGNOSIS — I517 Cardiomegaly: Secondary | ICD-10-CM | POA: Diagnosis not present

## 2024-02-19 DIAGNOSIS — G4733 Obstructive sleep apnea (adult) (pediatric): Secondary | ICD-10-CM | POA: Diagnosis not present

## 2024-02-19 LAB — ECHOCARDIOGRAM COMPLETE
Area-P 1/2: 4.68 cm2
S' Lateral: 3.81 cm

## 2024-02-22 ENCOUNTER — Encounter: Payer: Self-pay | Admitting: Physician Assistant

## 2024-02-22 DIAGNOSIS — I340 Nonrheumatic mitral (valve) insufficiency: Secondary | ICD-10-CM | POA: Insufficient documentation

## 2024-03-08 ENCOUNTER — Other Ambulatory Visit: Payer: Self-pay | Admitting: Physician Assistant

## 2024-03-08 DIAGNOSIS — L219 Seborrheic dermatitis, unspecified: Secondary | ICD-10-CM

## 2024-03-25 ENCOUNTER — Other Ambulatory Visit: Payer: Self-pay | Admitting: Physician Assistant

## 2024-03-25 DIAGNOSIS — J3089 Other allergic rhinitis: Secondary | ICD-10-CM

## 2024-04-15 ENCOUNTER — Other Ambulatory Visit: Payer: Self-pay | Admitting: Physician Assistant

## 2024-04-15 DIAGNOSIS — I10 Essential (primary) hypertension: Secondary | ICD-10-CM

## 2024-04-15 DIAGNOSIS — E785 Hyperlipidemia, unspecified: Secondary | ICD-10-CM

## 2024-05-07 ENCOUNTER — Other Ambulatory Visit: Payer: Self-pay | Admitting: Physician Assistant

## 2024-05-07 DIAGNOSIS — J3089 Other allergic rhinitis: Secondary | ICD-10-CM

## 2024-05-20 ENCOUNTER — Ambulatory Visit: Payer: 59 | Admitting: Physician Assistant

## 2024-05-27 ENCOUNTER — Ambulatory Visit (INDEPENDENT_AMBULATORY_CARE_PROVIDER_SITE_OTHER): Admitting: Physician Assistant

## 2024-05-27 ENCOUNTER — Encounter: Payer: Self-pay | Admitting: Physician Assistant

## 2024-05-27 VITALS — BP 124/78 | HR 74 | Ht 71.0 in | Wt 325.0 lb

## 2024-05-27 DIAGNOSIS — E782 Mixed hyperlipidemia: Secondary | ICD-10-CM | POA: Diagnosis not present

## 2024-05-27 DIAGNOSIS — Z125 Encounter for screening for malignant neoplasm of prostate: Secondary | ICD-10-CM

## 2024-05-27 DIAGNOSIS — Z0000 Encounter for general adult medical examination without abnormal findings: Secondary | ICD-10-CM | POA: Diagnosis not present

## 2024-05-27 DIAGNOSIS — G4733 Obstructive sleep apnea (adult) (pediatric): Secondary | ICD-10-CM

## 2024-05-27 DIAGNOSIS — I10 Essential (primary) hypertension: Secondary | ICD-10-CM | POA: Diagnosis not present

## 2024-05-27 DIAGNOSIS — E785 Hyperlipidemia, unspecified: Secondary | ICD-10-CM | POA: Diagnosis not present

## 2024-05-27 MED ORDER — LISINOPRIL 10 MG PO TABS: 10.0000 mg | ORAL_TABLET | Freq: Every day | ORAL | 1 refills | Status: AC

## 2024-05-27 MED ORDER — ATORVASTATIN CALCIUM 40 MG PO TABS: 40.0000 mg | ORAL_TABLET | Freq: Every day | ORAL | 3 refills | Status: AC

## 2024-05-27 NOTE — Progress Notes (Signed)
 Established Patient Office Visit  Subjective   Patient ID: Tanner Rodriguez, male    DOB: Apr 03, 1971  Age: 53 y.o. MRN: [id number]  Chief Complaint  Patient presents with   Medical Management of Chronic Issues    Essential hypertension      HPI Pt is a 53 yo obese male with HTN, HLD, OSA who presents to the clinic for medication refills. Pt is doing well with no concerns. He is using his CPAP nightly. He denies any CP, SOB, or palpitations. He was recently send by cardiology for preventative consult. No changes were made to medication. He was encouraged to stop smoking and start exercising. He has not started exercising. He has cut back to 5-6 cigarettes a day.    Review of Systems  All other systems reviewed and are negative.     Objective:     BP 124/78   Pulse 74   Ht 5' 11 (1.803 m)   Wt (!) 325 lb (147.4 kg)   SpO2 99%   BMI 45.33 kg/m  BP Readings from Last 3 Encounters:  05/27/24 124/78  01/21/24 122/74  11/20/23 (!) 130/58   Wt Readings from Last 3 Encounters:  05/27/24 (!) 325 lb (147.4 kg)  01/21/24 (!) 325 lb (147.4 kg)  11/20/23 (!) 319 lb (144.7 kg)      Physical Exam Constitutional:      Appearance: Normal appearance. He is obese.  Neck:     Vascular: No carotid bruit.  Cardiovascular:     Rate and Rhythm: Normal rate and regular rhythm.     Heart sounds: Murmur heard.  Pulmonary:     Effort: Pulmonary effort is normal.     Breath sounds: Normal breath sounds.  Musculoskeletal:     Cervical back: Normal range of motion and neck supple.     Right lower leg: No edema.     Left lower leg: No edema.  Neurological:     General: No focal deficit present.     Mental Status: He is alert and oriented to person, place, and time.  Psychiatric:        Mood and Affect: Mood normal.         Assessment & Plan:  Tanner Rodriguez was seen today for medical management of chronic issues.  Diagnoses and all orders for this visit:  Essential  hypertension -     CMP14+EGFR -     lisinopril  (ZESTRIL ) 10 MG tablet; Take 1 tablet (10 mg total) by mouth daily. -     atorvastatin  (LIPITOR) 40 MG tablet; Take 1 tablet (40 mg total) by mouth daily.  Mixed hyperlipidemia -     CMP14+EGFR  OSA on CPAP  Prostate cancer screening -     PSA  Routine physical examination -     Lipid panel -     CMP14+EGFR -     CBC w/Diff/Platelet -     PSA  Hyperlipidemia, unspecified hyperlipidemia type -     lisinopril  (ZESTRIL ) 10 MG tablet; Take 1 tablet (10 mg total) by mouth daily. -     atorvastatin  (LIPITOR) 40 MG tablet; Take 1 tablet (40 mg total) by mouth daily.   BP to goal, continue lisinopril  LDL to goal, continue lipitor Recheck fasting labs today with prostate screening Continue to use CPaP nightly Urged to consider walking 30 minutes a day and continue to cut back on cigarettes He has the cologuard kit and reports he is planning to return it, encouraged to do  so.    Return in about 6 months (around 11/27/2024).    Nilda Keathley, PA-C

## 2024-05-28 LAB — CBC WITH DIFFERENTIAL/PLATELET
Basophils Absolute: 0.1 x10E3/uL (ref 0.0–0.2)
Basos: 1 %
EOS (ABSOLUTE): 0.1 x10E3/uL (ref 0.0–0.4)
Eos: 2 %
Hematocrit: 47.2 % (ref 37.5–51.0)
Hemoglobin: 15.8 g/dL (ref 13.0–17.7)
Immature Grans (Abs): 0 x10E3/uL (ref 0.0–0.1)
Immature Granulocytes: 0 %
Lymphocytes Absolute: 1.7 x10E3/uL (ref 0.7–3.1)
Lymphs: 26 %
MCH: 31.7 pg (ref 26.6–33.0)
MCHC: 33.5 g/dL (ref 31.5–35.7)
MCV: 95 fL (ref 79–97)
Monocytes Absolute: 0.5 x10E3/uL (ref 0.1–0.9)
Monocytes: 7 %
Neutrophils Absolute: 4.1 x10E3/uL (ref 1.4–7.0)
Neutrophils: 64 %
Platelets: 209 x10E3/uL (ref 150–450)
RBC: 4.98 x10E6/uL (ref 4.14–5.80)
RDW: 12.7 % (ref 11.6–15.4)
WBC: 6.4 x10E3/uL (ref 3.4–10.8)

## 2024-05-28 LAB — CMP14+EGFR
ALT: 26 IU/L (ref 0–44)
AST: 24 IU/L (ref 0–40)
Albumin: 4.2 g/dL (ref 3.8–4.9)
Alkaline Phosphatase: 72 IU/L (ref 44–121)
BUN/Creatinine Ratio: 10 (ref 9–20)
BUN: 11 mg/dL (ref 6–24)
Bilirubin Total: 0.8 mg/dL (ref 0.0–1.2)
CO2: 21 mmol/L (ref 20–29)
Calcium: 9.5 mg/dL (ref 8.7–10.2)
Chloride: 102 mmol/L (ref 96–106)
Creatinine, Ser: 1.12 mg/dL (ref 0.76–1.27)
Globulin, Total: 2.6 g/dL (ref 1.5–4.5)
Glucose: 94 mg/dL (ref 70–99)
Potassium: 4.2 mmol/L (ref 3.5–5.2)
Sodium: 137 mmol/L (ref 134–144)
Total Protein: 6.8 g/dL (ref 6.0–8.5)
eGFR: 78 mL/min/1.73 (ref >59)

## 2024-05-28 LAB — LIPID PANEL
Chol/HDL Ratio: 3.2 ratio (ref 0.0–5.0)
Cholesterol, Total: 140 mg/dL (ref 100–199)
HDL: 44 mg/dL (ref >39)
LDL Chol Calc (NIH): 79 mg/dL (ref 0–99)
Triglycerides: 88 mg/dL (ref 0–149)
VLDL Cholesterol Cal: 17 mg/dL (ref 5–40)

## 2024-05-28 LAB — PSA: Prostate Specific Ag, Serum: 0.4 ng/mL (ref 0.0–4.0)

## 2024-05-30 ENCOUNTER — Ambulatory Visit: Payer: Self-pay | Admitting: Physician Assistant

## 2024-05-30 NOTE — Progress Notes (Signed)
 Tanner Rodriguez,   Labs stable and look GREAT!

## 2024-06-12 ENCOUNTER — Other Ambulatory Visit: Payer: Self-pay | Admitting: Physician Assistant

## 2024-06-12 DIAGNOSIS — J3089 Other allergic rhinitis: Secondary | ICD-10-CM

## 2024-07-27 ENCOUNTER — Ambulatory Visit: Admitting: Physician Assistant

## 2024-07-31 ENCOUNTER — Encounter: Payer: Self-pay | Admitting: Physician Assistant

## 2024-07-31 DIAGNOSIS — L219 Seborrheic dermatitis, unspecified: Secondary | ICD-10-CM

## 2024-08-01 MED ORDER — KETOCONAZOLE 2 % EX CREA: TOPICAL_CREAM | CUTANEOUS | 0 refills | Status: AC

## 2024-08-11 ENCOUNTER — Other Ambulatory Visit: Payer: Self-pay | Admitting: Physician Assistant

## 2024-08-11 DIAGNOSIS — J3089 Other allergic rhinitis: Secondary | ICD-10-CM

## 2024-10-04 ENCOUNTER — Other Ambulatory Visit: Payer: Self-pay | Admitting: Physician Assistant

## 2024-10-04 DIAGNOSIS — J3089 Other allergic rhinitis: Secondary | ICD-10-CM

## 2024-11-21 ENCOUNTER — Other Ambulatory Visit: Payer: Self-pay | Admitting: Physician Assistant

## 2024-11-21 DIAGNOSIS — J3089 Other allergic rhinitis: Secondary | ICD-10-CM

## 2024-12-02 ENCOUNTER — Ambulatory Visit: Admitting: Physician Assistant
